# Patient Record
Sex: Female | Born: 1955 | Race: White | Hispanic: No | Marital: Married | Smoking: Current every day smoker
Health system: Southern US, Community
[De-identification: ages and names within clinical notes are randomized; demographics above are authoritative.]

## PROBLEM LIST (undated history)

## (undated) DIAGNOSIS — G43909 Migraine, unspecified, not intractable, without status migrainosus: Secondary | ICD-10-CM

## (undated) DIAGNOSIS — F419 Anxiety disorder, unspecified: Secondary | ICD-10-CM

## (undated) DIAGNOSIS — F32A Depression, unspecified: Secondary | ICD-10-CM

## (undated) DIAGNOSIS — T7840XA Allergy, unspecified, initial encounter: Secondary | ICD-10-CM

## (undated) DIAGNOSIS — I10 Essential (primary) hypertension: Secondary | ICD-10-CM

## (undated) DIAGNOSIS — M810 Age-related osteoporosis without current pathological fracture: Secondary | ICD-10-CM

## (undated) DIAGNOSIS — M1990 Unspecified osteoarthritis, unspecified site: Secondary | ICD-10-CM

## (undated) HISTORY — DX: Anxiety disorder, unspecified: F41.9

## (undated) HISTORY — DX: Depression, unspecified: F32.A

## (undated) HISTORY — DX: Age-related osteoporosis without current pathological fracture: M81.0

## (undated) HISTORY — DX: Allergy, unspecified, initial encounter: T78.40XA

## (undated) HISTORY — DX: Migraine, unspecified, not intractable, without status migrainosus: G43.909

## (undated) HISTORY — DX: Unspecified osteoarthritis, unspecified site: M19.90

## (undated) HISTORY — PX: CRYOABLATION: SHX1415

## (undated) HISTORY — DX: Essential (primary) hypertension: I10

## (undated) HISTORY — PX: TUBAL LIGATION: SHX77

---

## 2006-09-27 HISTORY — PX: ENDOMETRIAL ABLATION: SHX621

## 2008-02-15 ENCOUNTER — Other Ambulatory Visit: Admission: RE | Admit: 2008-02-15 | Discharge: 2008-02-15 | Payer: Self-pay | Admitting: Internal Medicine

## 2008-03-21 ENCOUNTER — Encounter: Admission: RE | Admit: 2008-03-21 | Discharge: 2008-03-21 | Payer: Self-pay | Admitting: Internal Medicine

## 2008-03-28 ENCOUNTER — Encounter: Admission: RE | Admit: 2008-03-28 | Discharge: 2008-03-28 | Payer: Self-pay | Admitting: Internal Medicine

## 2008-04-04 ENCOUNTER — Ambulatory Visit: Payer: Self-pay | Admitting: Internal Medicine

## 2008-04-15 ENCOUNTER — Ambulatory Visit: Payer: Self-pay | Admitting: Internal Medicine

## 2009-05-22 ENCOUNTER — Ambulatory Visit: Payer: Self-pay | Admitting: Internal Medicine

## 2009-06-05 ENCOUNTER — Encounter: Admission: RE | Admit: 2009-06-05 | Discharge: 2009-06-05 | Payer: Self-pay | Admitting: Internal Medicine

## 2009-06-19 ENCOUNTER — Ambulatory Visit: Payer: Self-pay | Admitting: Internal Medicine

## 2009-07-24 ENCOUNTER — Ambulatory Visit: Payer: Self-pay | Admitting: Internal Medicine

## 2010-01-26 ENCOUNTER — Ambulatory Visit: Payer: Self-pay | Admitting: Internal Medicine

## 2010-01-30 ENCOUNTER — Ambulatory Visit: Payer: Self-pay | Admitting: Internal Medicine

## 2010-03-02 ENCOUNTER — Ambulatory Visit: Payer: Self-pay | Admitting: Internal Medicine

## 2010-06-04 ENCOUNTER — Other Ambulatory Visit: Admission: RE | Admit: 2010-06-04 | Discharge: 2010-06-04 | Payer: Self-pay | Admitting: Internal Medicine

## 2010-06-04 ENCOUNTER — Ambulatory Visit: Payer: Self-pay | Admitting: Internal Medicine

## 2010-07-09 ENCOUNTER — Encounter: Admission: RE | Admit: 2010-07-09 | Discharge: 2010-07-09 | Payer: Self-pay | Admitting: Internal Medicine

## 2010-07-09 LAB — HM MAMMOGRAPHY

## 2011-01-07 ENCOUNTER — Other Ambulatory Visit: Payer: Self-pay | Admitting: Internal Medicine

## 2011-03-27 ENCOUNTER — Other Ambulatory Visit: Payer: Self-pay | Admitting: Internal Medicine

## 2011-07-13 ENCOUNTER — Other Ambulatory Visit: Payer: Self-pay | Admitting: Internal Medicine

## 2011-07-13 ENCOUNTER — Telehealth: Payer: Self-pay | Admitting: Internal Medicine

## 2011-07-13 DIAGNOSIS — Z1231 Encounter for screening mammogram for malignant neoplasm of breast: Secondary | ICD-10-CM

## 2011-07-14 NOTE — Telephone Encounter (Signed)
Nicole Bowers Please pull chart  For Nicole Bowers to evaluate when last bone density was done. Cannot answer question without knowledge of last bone density report and results.

## 2011-07-15 NOTE — Telephone Encounter (Signed)
Last bone density done in 2009 with normal results for age.

## 2011-07-16 ENCOUNTER — Other Ambulatory Visit: Payer: Self-pay | Admitting: Internal Medicine

## 2011-07-16 DIAGNOSIS — N951 Menopausal and female climacteric states: Secondary | ICD-10-CM

## 2011-07-16 NOTE — Telephone Encounter (Signed)
Last bone density was normal 3 years ago. [facility]

## 2011-07-16 NOTE — Telephone Encounter (Signed)
Order for BDS faxed to [facility].

## 2011-07-20 ENCOUNTER — Other Ambulatory Visit: Payer: Self-pay | Admitting: Internal Medicine

## 2011-07-22 ENCOUNTER — Encounter: Payer: Self-pay | Admitting: Internal Medicine

## 2011-08-05 ENCOUNTER — Encounter: Payer: Self-pay | Admitting: Internal Medicine

## 2011-08-09 ENCOUNTER — Other Ambulatory Visit: Payer: Self-pay | Admitting: Internal Medicine

## 2011-08-10 ENCOUNTER — Encounter: Payer: Self-pay | Admitting: Internal Medicine

## 2011-08-12 ENCOUNTER — Ambulatory Visit: Payer: Self-pay

## 2011-08-12 ENCOUNTER — Other Ambulatory Visit: Payer: Self-pay

## 2011-09-02 ENCOUNTER — Ambulatory Visit
Admission: RE | Admit: 2011-09-02 | Discharge: 2011-09-02 | Disposition: A | Discharge status: Home / Self Care | Payer: BC Managed Care – PPO | Source: Ambulatory Visit | Attending: Internal Medicine | Admitting: Internal Medicine

## 2011-09-02 DIAGNOSIS — Z1231 Encounter for screening mammogram for malignant neoplasm of breast: Secondary | ICD-10-CM

## 2011-09-02 DIAGNOSIS — N951 Menopausal and female climacteric states: Secondary | ICD-10-CM

## 2011-09-07 ENCOUNTER — Other Ambulatory Visit: Payer: BC Managed Care – PPO | Admitting: Internal Medicine

## 2011-09-07 DIAGNOSIS — I10 Essential (primary) hypertension: Secondary | ICD-10-CM

## 2011-09-07 DIAGNOSIS — Z0000 Encounter for general adult medical examination without abnormal findings: Secondary | ICD-10-CM

## 2011-09-07 LAB — COMPREHENSIVE METABOLIC PANEL
ALT: 18 U/L (ref 0–35)
AST: 18 U/L (ref 0–37)
Albumin: 4.4 g/dL (ref 3.5–5.2)
Alkaline Phosphatase: 86 U/L (ref 39–117)
BUN: 20 mg/dL (ref 6–23)
CO2: 25 mEq/L (ref 19–32)
Calcium: 9.6 mg/dL (ref 8.4–10.5)
Chloride: 103 mEq/L (ref 96–112)
Creat: 0.8 mg/dL (ref 0.50–1.10)
Glucose, Bld: 99 mg/dL (ref 70–99)
Potassium: 5 mEq/L (ref 3.5–5.3)
Sodium: 136 mEq/L (ref 135–145)
Total Bilirubin: 0.3 mg/dL (ref 0.3–1.2)
Total Protein: 6.9 g/dL (ref 6.0–8.3)

## 2011-09-07 LAB — CBC WITH DIFFERENTIAL/PLATELET
Basophils Absolute: 0 10*3/uL (ref 0.0–0.1)
Basophils Relative: 0 % (ref 0–1)
Eosinophils Absolute: 0.1 10*3/uL (ref 0.0–0.7)
Eosinophils Relative: 1 % (ref 0–5)
HCT: 45.3 % (ref 36.0–46.0)
Hemoglobin: 14.9 g/dL (ref 12.0–15.0)
Lymphocytes Relative: 23 % (ref 12–46)
Lymphs Abs: 1.9 10*3/uL (ref 0.7–4.0)
MCH: 28.4 pg (ref 26.0–34.0)
MCHC: 32.9 g/dL (ref 30.0–36.0)
MCV: 86.5 fL (ref 78.0–100.0)
Monocytes Absolute: 0.5 10*3/uL (ref 0.1–1.0)
Monocytes Relative: 6 % (ref 3–12)
Neutro Abs: 5.7 10*3/uL (ref 1.7–7.7)
Neutrophils Relative %: 70 % (ref 43–77)
Platelets: 259 10*3/uL (ref 150–400)
RBC: 5.24 MIL/uL — ABNORMAL HIGH (ref 3.87–5.11)
RDW: 14.8 % (ref 11.5–15.5)
WBC: 8.2 10*3/uL (ref 4.0–10.5)

## 2011-09-07 LAB — LIPID PANEL
Cholesterol: 190 mg/dL (ref 0–200)
HDL: 68 mg/dL (ref >39)
LDL Cholesterol: 113 mg/dL — ABNORMAL HIGH (ref 0–99)
Total CHOL/HDL Ratio: 2.8 Ratio
Triglycerides: 47 mg/dL (ref <150)
VLDL: 9 mg/dL (ref 0–40)

## 2011-09-07 LAB — TSH: TSH: 1.252 u[IU]/mL (ref 0.350–4.500)

## 2011-09-08 LAB — VITAMIN D 25 HYDROXY (VIT D DEFICIENCY, FRACTURES): Vit D, 25-Hydroxy: 40 ng/mL (ref 30–89)

## 2011-09-09 ENCOUNTER — Ambulatory Visit (INDEPENDENT_AMBULATORY_CARE_PROVIDER_SITE_OTHER): Payer: BC Managed Care – PPO | Admitting: Internal Medicine

## 2011-09-09 DIAGNOSIS — Z0000 Encounter for general adult medical examination without abnormal findings: Secondary | ICD-10-CM

## 2011-09-09 DIAGNOSIS — I10 Essential (primary) hypertension: Secondary | ICD-10-CM

## 2011-09-09 DIAGNOSIS — M7918 Myalgia, other site: Secondary | ICD-10-CM

## 2011-09-09 DIAGNOSIS — F419 Anxiety disorder, unspecified: Secondary | ICD-10-CM

## 2011-09-27 ENCOUNTER — Encounter: Payer: Self-pay | Admitting: Internal Medicine

## 2011-09-27 DIAGNOSIS — F418 Other specified anxiety disorders: Secondary | ICD-10-CM | POA: Insufficient documentation

## 2011-09-27 DIAGNOSIS — M7918 Myalgia, other site: Secondary | ICD-10-CM | POA: Insufficient documentation

## 2011-09-27 DIAGNOSIS — I10 Essential (primary) hypertension: Secondary | ICD-10-CM | POA: Insufficient documentation

## 2011-09-27 DIAGNOSIS — F419 Anxiety disorder, unspecified: Secondary | ICD-10-CM | POA: Insufficient documentation

## 2011-09-27 NOTE — Patient Instructions (Addendum)
Continue same medication and return in 6 months. 

## 2011-12-12 ENCOUNTER — Encounter: Payer: Self-pay | Admitting: Internal Medicine
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
## 2012-03-09 ENCOUNTER — Encounter: Payer: Self-pay | Admitting: Internal Medicine

## 2012-03-09 ENCOUNTER — Ambulatory Visit (INDEPENDENT_AMBULATORY_CARE_PROVIDER_SITE_OTHER): Payer: BC Managed Care – PPO | Admitting: Internal Medicine

## 2012-03-09 VITALS — BP 142/88 | HR 72 | Temp 98.6°F | Wt 143.0 lb

## 2012-03-09 DIAGNOSIS — F411 Generalized anxiety disorder: Secondary | ICD-10-CM

## 2012-03-09 DIAGNOSIS — IMO0002 Reserved for concepts with insufficient information to code with codable children: Secondary | ICD-10-CM

## 2012-03-09 DIAGNOSIS — I10 Essential (primary) hypertension: Secondary | ICD-10-CM

## 2012-03-09 DIAGNOSIS — F419 Anxiety disorder, unspecified: Secondary | ICD-10-CM

## 2012-03-26 ENCOUNTER — Encounter: Payer: Self-pay | Admitting: Internal Medicine

## 2012-03-26 NOTE — Patient Instructions (Addendum)
Continue same medications and return in 6 months. For paronychia take antibiotics as prescribed
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
## 2012-09-11 ENCOUNTER — Other Ambulatory Visit: Payer: BC Managed Care – PPO | Admitting: Internal Medicine

## 2012-09-11 DIAGNOSIS — I10 Essential (primary) hypertension: Secondary | ICD-10-CM

## 2012-09-11 DIAGNOSIS — E559 Vitamin D deficiency, unspecified: Secondary | ICD-10-CM

## 2012-09-11 LAB — COMPREHENSIVE METABOLIC PANEL
ALT: 29 U/L (ref 0–35)
AST: 22 U/L (ref 0–37)
Albumin: 4.5 g/dL (ref 3.5–5.2)
BUN: 18 mg/dL (ref 6–23)
CO2: 18 mEq/L — ABNORMAL LOW (ref 19–32)
Calcium: 9.7 mg/dL (ref 8.4–10.5)
Chloride: 103 mEq/L (ref 96–112)
Creat: 0.8 mg/dL (ref 0.50–1.10)
Glucose, Bld: 91 mg/dL (ref 70–99)
Potassium: 4.5 mEq/L (ref 3.5–5.3)
Sodium: 140 mEq/L (ref 135–145)
Total Bilirubin: 0.4 mg/dL (ref 0.3–1.2)
Total Protein: 7.1 g/dL (ref 6.0–8.3)

## 2012-09-11 LAB — CBC WITH DIFFERENTIAL/PLATELET
Basophils Absolute: 0 10*3/uL (ref 0.0–0.1)
Eosinophils Absolute: 0.1 10*3/uL (ref 0.0–0.7)
Eosinophils Relative: 1 % (ref 0–5)
HCT: 44.3 % (ref 36.0–46.0)
Hemoglobin: 14.6 g/dL (ref 12.0–15.0)
Lymphocytes Relative: 29 % (ref 12–46)
Lymphs Abs: 2 10*3/uL (ref 0.7–4.0)
MCV: 85 fL (ref 78.0–100.0)
Monocytes Absolute: 0.5 10*3/uL (ref 0.1–1.0)
Monocytes Relative: 7 % (ref 3–12)
Neutro Abs: 4.2 10*3/uL (ref 1.7–7.7)
Platelets: 245 10*3/uL (ref 150–400)
RDW: 13.9 % (ref 11.5–15.5)
WBC: 6.7 10*3/uL (ref 4.0–10.5)

## 2012-09-11 LAB — LIPID PANEL
Cholesterol: 185 mg/dL (ref 0–200)
HDL: 91 mg/dL (ref >39)
Total CHOL/HDL Ratio: 2 Ratio
Triglycerides: 41 mg/dL (ref <150)
VLDL: 8 mg/dL (ref 0–40)

## 2012-09-11 LAB — TSH: TSH: 1.626 u[IU]/mL (ref 0.350–4.500)

## 2012-09-12 ENCOUNTER — Ambulatory Visit (INDEPENDENT_AMBULATORY_CARE_PROVIDER_SITE_OTHER): Payer: BC Managed Care – PPO | Admitting: Internal Medicine

## 2012-09-12 ENCOUNTER — Encounter: Payer: Self-pay | Admitting: Internal Medicine

## 2012-09-12 VITALS — BP 124/82 | HR 80 | Temp 98.5°F | Ht 64.0 in | Wt 151.0 lb

## 2012-09-12 DIAGNOSIS — I10 Essential (primary) hypertension: Secondary | ICD-10-CM

## 2012-09-12 DIAGNOSIS — F411 Generalized anxiety disorder: Secondary | ICD-10-CM

## 2012-09-12 LAB — POCT URINALYSIS DIPSTICK
Bilirubin, UA: NEGATIVE
Blood, UA: NEGATIVE
Glucose, UA: NEGATIVE
Ketones, UA: NEGATIVE
Leukocytes, UA: NEGATIVE
Nitrite, UA: NEGATIVE
Protein, UA: NEGATIVE
Spec Grav, UA: 1.01

## 2012-09-12 LAB — VITAMIN D 25 HYDROXY (VIT D DEFICIENCY, FRACTURES): Vit D, 25-Hydroxy: 44 ng/mL (ref 30–89)

## 2012-09-12 NOTE — Patient Instructions (Addendum)
Continue same meds and return in 6 months 

## 2012-12-10 ENCOUNTER — Encounter: Payer: Self-pay | Admitting: Internal Medicine
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
## 2012-12-26 ENCOUNTER — Other Ambulatory Visit: Payer: Self-pay

## 2012-12-26 MED ORDER — RAMIPRIL 10 MG PO CAPS: 10.0000 mg | ORAL_CAPSULE | Freq: Every day | ORAL | Status: DC

## 2013-06-17 ENCOUNTER — Other Ambulatory Visit: Payer: Self-pay | Admitting: Internal Medicine

## 2013-09-14 ENCOUNTER — Other Ambulatory Visit: Payer: BC Managed Care – PPO | Admitting: Internal Medicine

## 2013-09-17 ENCOUNTER — Encounter: Payer: BC Managed Care – PPO | Admitting: Internal Medicine

## 2013-10-22 ENCOUNTER — Other Ambulatory Visit: Payer: BC Managed Care – PPO | Admitting: Internal Medicine

## 2013-10-22 DIAGNOSIS — Z130 Encounter for screening for diseases of the blood and blood-forming organs and certain disorders involving the immune mechanism: Secondary | ICD-10-CM

## 2013-10-22 DIAGNOSIS — Z13228 Encounter for screening for other metabolic disorders: Secondary | ICD-10-CM

## 2013-10-22 DIAGNOSIS — I10 Essential (primary) hypertension: Secondary | ICD-10-CM

## 2013-10-22 DIAGNOSIS — Z13220 Encounter for screening for lipoid disorders: Secondary | ICD-10-CM

## 2013-10-22 DIAGNOSIS — Z1329 Encounter for screening for other suspected endocrine disorder: Secondary | ICD-10-CM

## 2013-10-22 LAB — TSH: TSH: 1.73 u[IU]/mL (ref 0.350–4.500)

## 2013-10-22 LAB — COMPREHENSIVE METABOLIC PANEL
ALT: 17 U/L (ref 0–35)
AST: 15 U/L (ref 0–37)
Albumin: 4.4 g/dL (ref 3.5–5.2)
Alkaline Phosphatase: 92 U/L (ref 39–117)
BUN: 21 mg/dL (ref 6–23)
CO2: 28 mEq/L (ref 19–32)
Calcium: 9.5 mg/dL (ref 8.4–10.5)
Chloride: 104 mEq/L (ref 96–112)
Creat: 0.88 mg/dL (ref 0.50–1.10)
Glucose, Bld: 102 mg/dL — ABNORMAL HIGH (ref 70–99)
Potassium: 4.4 mEq/L (ref 3.5–5.3)
Sodium: 137 mEq/L (ref 135–145)
Total Bilirubin: 0.4 mg/dL (ref 0.3–1.2)
Total Protein: 7 g/dL (ref 6.0–8.3)

## 2013-10-22 LAB — LIPID PANEL
Cholesterol: 194 mg/dL (ref 0–200)
HDL: 74 mg/dL (ref >39)
LDL Cholesterol: 109 mg/dL — ABNORMAL HIGH (ref 0–99)
Total CHOL/HDL Ratio: 2.6 Ratio
Triglycerides: 57 mg/dL (ref <150)
VLDL: 11 mg/dL (ref 0–40)

## 2013-10-22 LAB — CBC WITH DIFFERENTIAL/PLATELET
Basophils Absolute: 0 10*3/uL (ref 0.0–0.1)
Basophils Relative: 0 % (ref 0–1)
Eosinophils Absolute: 0.1 10*3/uL (ref 0.0–0.7)
Eosinophils Relative: 1 % (ref 0–5)
HCT: 42.9 % (ref 36.0–46.0)
Hemoglobin: 14.1 g/dL (ref 12.0–15.0)
Lymphocytes Relative: 34 % (ref 12–46)
Lymphs Abs: 2.4 10*3/uL (ref 0.7–4.0)
MCH: 28.1 pg (ref 26.0–34.0)
MCHC: 32.9 g/dL (ref 30.0–36.0)
MCV: 85.5 fL (ref 78.0–100.0)
Monocytes Absolute: 0.5 10*3/uL (ref 0.1–1.0)
Monocytes Relative: 7 % (ref 3–12)
Neutro Abs: 4.1 10*3/uL (ref 1.7–7.7)
Neutrophils Relative %: 58 % (ref 43–77)
Platelets: 252 10*3/uL (ref 150–400)
RBC: 5.02 MIL/uL (ref 3.87–5.11)
RDW: 14 % (ref 11.5–15.5)
WBC: 7.1 10*3/uL (ref 4.0–10.5)

## 2013-10-23 ENCOUNTER — Encounter: Payer: Self-pay | Admitting: Internal Medicine

## 2013-10-23 LAB — VITAMIN D 25 HYDROXY (VIT D DEFICIENCY, FRACTURES): Vit D, 25-Hydroxy: 40 ng/mL (ref 30–89)

## 2013-10-26 ENCOUNTER — Other Ambulatory Visit: Payer: Self-pay | Admitting: Internal Medicine

## 2013-10-30 ENCOUNTER — Other Ambulatory Visit: Payer: Self-pay | Admitting: Internal Medicine

## 2013-10-30 ENCOUNTER — Other Ambulatory Visit: Payer: Self-pay

## 2013-10-30 DIAGNOSIS — N951 Menopausal and female climacteric states: Secondary | ICD-10-CM

## 2013-10-30 DIAGNOSIS — Z1231 Encounter for screening mammogram for malignant neoplasm of breast: Secondary | ICD-10-CM

## 2013-11-05 ENCOUNTER — Other Ambulatory Visit (HOSPITAL_COMMUNITY)
Admission: RE | Admit: 2013-11-05 | Discharge: 2013-11-05 | Disposition: A | Discharge status: Home / Self Care | Payer: BC Managed Care – PPO | Source: Ambulatory Visit | Attending: Internal Medicine | Admitting: Internal Medicine

## 2013-11-05 ENCOUNTER — Ambulatory Visit (INDEPENDENT_AMBULATORY_CARE_PROVIDER_SITE_OTHER): Payer: BC Managed Care – PPO | Admitting: Internal Medicine

## 2013-11-05 ENCOUNTER — Encounter: Payer: Self-pay | Admitting: Internal Medicine

## 2013-11-05 VITALS — BP 140/82 | HR 84 | Temp 98.9°F | Ht 64.0 in | Wt 150.0 lb

## 2013-11-05 DIAGNOSIS — Z0000 Encounter for general adult medical examination without abnormal findings: Secondary | ICD-10-CM

## 2013-11-05 DIAGNOSIS — Z8659 Personal history of other mental and behavioral disorders: Secondary | ICD-10-CM

## 2013-11-05 DIAGNOSIS — IMO0001 Reserved for inherently not codable concepts without codable children: Secondary | ICD-10-CM

## 2013-11-05 DIAGNOSIS — M7918 Myalgia, other site: Secondary | ICD-10-CM

## 2013-11-05 DIAGNOSIS — I10 Essential (primary) hypertension: Secondary | ICD-10-CM

## 2013-11-05 DIAGNOSIS — Z01419 Encounter for gynecological examination (general) (routine) without abnormal findings: Secondary | ICD-10-CM | POA: Insufficient documentation

## 2013-11-05 LAB — POCT URINALYSIS DIPSTICK
Bilirubin, UA: NEGATIVE
Blood, UA: NEGATIVE
Glucose, UA: NEGATIVE
Ketones, UA: NEGATIVE
Leukocytes, UA: NEGATIVE
Nitrite, UA: NEGATIVE
Protein, UA: NEGATIVE
Spec Grav, UA: 1.01
Urobilinogen, UA: NEGATIVE
pH, UA: 5.5

## 2013-11-09 NOTE — Progress Notes (Signed)
Patient informed. Will come in on Monday 11/12/2013 for repeat Pap.

## 2013-11-12 ENCOUNTER — Ambulatory Visit: Payer: BC Managed Care – PPO

## 2013-11-12 ENCOUNTER — Ambulatory Visit
Admission: RE | Admit: 2013-11-12 | Discharge: 2013-11-12 | Disposition: A | Discharge status: Home / Self Care | Payer: BC Managed Care – PPO | Source: Ambulatory Visit | Attending: Internal Medicine | Admitting: Internal Medicine

## 2013-11-12 ENCOUNTER — Ambulatory Visit
Admission: RE | Admit: 2013-11-12 | Discharge: 2013-11-12 | Disposition: A | Discharge status: Home / Self Care | Payer: BC Managed Care – PPO | Source: Ambulatory Visit

## 2013-11-12 ENCOUNTER — Ambulatory Visit (INDEPENDENT_AMBULATORY_CARE_PROVIDER_SITE_OTHER): Payer: BC Managed Care – PPO | Admitting: Internal Medicine

## 2013-11-12 DIAGNOSIS — R87615 Unsatisfactory cytologic smear of cervix: Secondary | ICD-10-CM

## 2013-11-12 DIAGNOSIS — N951 Menopausal and female climacteric states: Secondary | ICD-10-CM

## 2013-11-12 DIAGNOSIS — Z124 Encounter for screening for malignant neoplasm of cervix: Secondary | ICD-10-CM

## 2013-11-12 DIAGNOSIS — Z0000 Encounter for general adult medical examination without abnormal findings: Secondary | ICD-10-CM

## 2013-11-12 DIAGNOSIS — Z1231 Encounter for screening mammogram for malignant neoplasm of breast: Secondary | ICD-10-CM

## 2013-11-19 ENCOUNTER — Other Ambulatory Visit: Payer: Self-pay | Admitting: Internal Medicine

## 2013-11-24 ENCOUNTER — Encounter: Payer: Self-pay | Admitting: Internal Medicine

## 2013-11-24 NOTE — Patient Instructions (Signed)
Return as needed

## 2013-11-24 NOTE — Progress Notes (Signed)
   Subjective:    Patient ID: Nicole Bowers, female    DOB: 10-10-1955, 58 y.o.   MRN: [id number]  HPI returns for repeat Pap smear as there were no endocervical cells on last Pap done at time of physical exam    Review of Systems     Objective:   Physical Exam using saline coated Cytobrush Pap was repeated        Assessment & Plan:  Repeat Pap smear due to no endocervical cells on previous Pap smear

## 2013-12-03 ENCOUNTER — Telehealth: Payer: Self-pay | Admitting: Internal Medicine

## 2013-12-03 ENCOUNTER — Other Ambulatory Visit: Payer: Self-pay

## 2013-12-03 MED ORDER — ALPRAZOLAM 0.5 MG PO TABS: 0.5000 mg | ORAL_TABLET | Freq: Every evening | ORAL | Status: DC | PRN

## 2013-12-03 NOTE — Telephone Encounter (Signed)
Please refill Xanax

## 2014-01-31 ENCOUNTER — Encounter: Payer: Self-pay | Admitting: Internal Medicine

## 2014-01-31 ENCOUNTER — Ambulatory Visit (INDEPENDENT_AMBULATORY_CARE_PROVIDER_SITE_OTHER): Payer: BC Managed Care – PPO | Admitting: Internal Medicine

## 2014-01-31 VITALS — BP 134/84 | HR 80 | Temp 97.9°F | Wt 155.0 lb

## 2014-01-31 DIAGNOSIS — M25532 Pain in left wrist: Secondary | ICD-10-CM

## 2014-01-31 DIAGNOSIS — M25539 Pain in unspecified wrist: Secondary | ICD-10-CM

## 2014-01-31 MED ORDER — METHYLPREDNISOLONE (PAK) 4 MG PO TABS: ORAL_TABLET | ORAL | Status: DC

## 2014-02-02 NOTE — Progress Notes (Signed)
   Subjective:    Patient ID: Nicole Bowers, female    DOB: 02/14/1956, 58 y.o.   MRN: [id number]  HPI  In today with left wrist pain. No fall or injury that she is aware of. 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 There is tenderness in the medial wrist. Suggested an x-ray but she's going out of town tomorrow for a dog show in [place], [place]. Will get it x-rayed next week. Good range of motion in left wrist just pain with flexion extending up to mid forearm dorsal aspect. No numbness in hand or tingling.    Review of Systems     Objective:   Physical Exam  Pain with flexion to mid forearm dorsal aspect. Slight tenderness medial left wrist to palpation.      Assessment & Plan:  Probable tendinitis left wrist  Plan: To have x-ray. Steroid 6 day dosepak take as directed. Recommend icing wrist for 20 minutes twice daily.

## 2014-02-02 NOTE — Patient Instructions (Signed)
Take steroid dosepak in tapering course as directed. Ice wrist for 20 minutes twice daily until improved. Get x-ray next week

## 2014-02-04 ENCOUNTER — Ambulatory Visit
Admission: RE | Admit: 2014-02-04 | Discharge: 2014-02-04 | Disposition: A | Discharge status: Home / Self Care | Payer: BC Managed Care – PPO | Source: Ambulatory Visit | Attending: Internal Medicine | Admitting: Internal Medicine

## 2014-02-04 DIAGNOSIS — M25532 Pain in left wrist: Secondary | ICD-10-CM

## 2014-03-20 ENCOUNTER — Telehealth: Payer: Self-pay | Admitting: Internal Medicine

## 2014-03-20 NOTE — Telephone Encounter (Signed)
Please refer to hand surgeon for evaluation. Dr. Merlyn LotKuzma is fine.

## 2014-03-21 NOTE — Telephone Encounter (Signed)
Appointment made w/Dr. Cindee SaltGary Kuzma (sp w/[facility] @ [phone]) for Monday, 6/29 @ 2:45 (arrive at 2:15).  Faxed notes/demographics to [facility] @ [phone].  Spoke w/[facility]; patient can pick up disc of image on Friday, 6/26 and bring ID w/her.    Spoke with patient and advised of appointment date/time, phone #, address.  Advised to call and speak with Alvino Chapelllen @ [phone] to confirm appointment.  Patient also [facility] to pick [facility] disc of wrist image and take with her to Dr. Merrilee SeashoreKuzma's appointment.
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
## 2014-04-21 NOTE — Patient Instructions (Signed)
Continue same medication for hypertension. Monitor blood pressure. Call if readings are persistently elevated. Otherwise return in 6-12 months. Have mammogram and bone density study.

## 2014-06-04 ENCOUNTER — Ambulatory Visit (INDEPENDENT_AMBULATORY_CARE_PROVIDER_SITE_OTHER): Payer: BC Managed Care – PPO | Admitting: Internal Medicine

## 2014-06-04 ENCOUNTER — Encounter: Payer: Self-pay | Admitting: Internal Medicine

## 2014-06-04 VITALS — BP 140/88 | HR 84 | Resp 12 | Ht 63.5 in | Wt 158.0 lb

## 2014-06-04 DIAGNOSIS — R030 Elevated blood-pressure reading, without diagnosis of hypertension: Secondary | ICD-10-CM

## 2014-06-04 DIAGNOSIS — IMO0001 Reserved for inherently not codable concepts without codable children: Secondary | ICD-10-CM

## 2014-06-04 DIAGNOSIS — F411 Generalized anxiety disorder: Secondary | ICD-10-CM

## 2014-06-05 ENCOUNTER — Other Ambulatory Visit: Payer: Self-pay | Admitting: Internal Medicine

## 2014-06-19 ENCOUNTER — Other Ambulatory Visit: Payer: Self-pay | Admitting: Internal Medicine

## 2014-07-02 ENCOUNTER — Encounter: Payer: Self-pay | Admitting: Internal Medicine

## 2014-07-08 ENCOUNTER — Telehealth: Payer: Self-pay

## 2014-07-08 MED ORDER — CLOTRIMAZOLE 1 % EX CREA: 1.0000 "application " | TOPICAL_CREAM | Freq: Two times a day (BID) | CUTANEOUS | Status: DC

## 2014-07-08 MED ORDER — TERCONAZOLE 0.4 % VA CREA: TOPICAL_CREAM | VAGINAL | Status: DC

## 2014-07-08 NOTE — Telephone Encounter (Signed)
Refill request for clotrimazole cream and terconazole cream.  Please advise.

## 2014-07-08 NOTE — Telephone Encounter (Signed)
Refill both with 3 refills each

## 2014-08-18 ENCOUNTER — Encounter: Payer: Self-pay | Admitting: Internal Medicine
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
## 2014-08-18 NOTE — Patient Instructions (Signed)
Continue to monitor blood pressure and let me know if persistently elevated

## 2014-09-03 ENCOUNTER — Ambulatory Visit (INDEPENDENT_AMBULATORY_CARE_PROVIDER_SITE_OTHER): Payer: BC Managed Care – PPO | Admitting: Internal Medicine

## 2014-09-03 ENCOUNTER — Telehealth: Payer: Self-pay

## 2014-09-03 ENCOUNTER — Encounter: Payer: Self-pay | Admitting: Internal Medicine

## 2014-09-03 VITALS — BP 128/88 | HR 89 | Temp 97.0°F | Wt 158.0 lb

## 2014-09-03 DIAGNOSIS — B373 Candidiasis of vulva and vagina: Secondary | ICD-10-CM

## 2014-09-03 DIAGNOSIS — N951 Menopausal and female climacteric states: Secondary | ICD-10-CM

## 2014-09-03 DIAGNOSIS — N949 Unspecified condition associated with female genital organs and menstrual cycle: Secondary | ICD-10-CM

## 2014-09-03 DIAGNOSIS — B3731 Acute candidiasis of vulva and vagina: Secondary | ICD-10-CM

## 2014-09-03 LAB — POCT WET PREP (WET MOUNT)
Bacteria Wet Prep HPF POC: NEGATIVE
TRICHOMONAS WET PREP HPF POC: NEGATIVE
WBC WET PREP: NEGATIVE

## 2014-09-03 LAB — POCT URINALYSIS DIPSTICK
Bilirubin, UA: NEGATIVE
Glucose, UA: NEGATIVE
Ketones, UA: NEGATIVE
Leukocytes, UA: NEGATIVE
NITRITE UA: NEGATIVE
PROTEIN UA: NEGATIVE
Spec Grav, UA: 1.01
UROBILINOGEN UA: NEGATIVE
pH, UA: 6.5

## 2014-09-03 MED ORDER — FLUCONAZOLE 150 MG PO TABS: 150.0000 mg | ORAL_TABLET | Freq: Once | ORAL | Status: DC

## 2014-09-03 MED ORDER — ESTRADIOL 0.1 MG/GM VA CREA: 1.0000 | TOPICAL_CREAM | VAGINAL | Status: DC

## 2014-09-03 NOTE — Telephone Encounter (Signed)
Patient has an appointment with Dr Seymour BarsLavoie at [facility] on 09/30/2013 at 1045 am.

## 2014-09-14 ENCOUNTER — Other Ambulatory Visit: Payer: Self-pay | Admitting: Internal Medicine

## 2014-09-16 ENCOUNTER — Other Ambulatory Visit: Payer: Self-pay | Admitting: *Deleted

## 2014-09-16 MED ORDER — ALPRAZOLAM 0.5 MG PO TABS: 0.5000 mg | ORAL_TABLET | Freq: Every evening | ORAL | Status: DC | PRN

## 2014-09-16 NOTE — Telephone Encounter (Signed)
Script for xanax called into pharmacy patient notified

## 2014-09-16 NOTE — Telephone Encounter (Signed)
Please refill and closed.

## 2014-09-16 NOTE — Telephone Encounter (Deleted)
Script called in

## 2014-09-16 NOTE — Telephone Encounter (Signed)
Refill x 6 months 

## 2014-09-25 NOTE — Patient Instructions (Addendum)
Take Diflucan 150 mg tablet by mouth once. Try Premarin vaginal cream 3 times weekly. Appointment with GYN.

## 2014-09-25 NOTE — Progress Notes (Signed)
   Subjective:    Patient ID: Nicole Bowers, female    DOB: 24-Apr-1956, 58 y.o.   MRN: [id number]  HPI  Patient continues to complain of vaginal irritation/discomfort. Some itching. Also having some menopausal symptoms with hot flashes. History of anxiety for which she takes Xanax. Wants to know what to do about hot flashes. Really doesn't want to take hormone replacement.    Review of Systems     Objective:   Physical Exam  She has some irritation at the introitus. Wet prep shows just a few yeast. No significant vaginal discharge.      Assessment & Plan:  Hot flashes  Vaginal irritation  History of anxiety  Hypertension  Plan: Diflucan 150 mg 1 time dose tablet by mouth. Premarin vaginal cream 1 applicator full in vagina 3 times weekly. Appointment with GYN physician.  25 minutes spent with patient

## 2014-11-08 ENCOUNTER — Other Ambulatory Visit: Payer: BLUE CROSS/BLUE SHIELD | Admitting: Internal Medicine

## 2014-11-08 DIAGNOSIS — Z1321 Encounter for screening for nutritional disorder: Secondary | ICD-10-CM

## 2014-11-08 DIAGNOSIS — Z1329 Encounter for screening for other suspected endocrine disorder: Secondary | ICD-10-CM

## 2014-11-08 DIAGNOSIS — Z130 Encounter for screening for diseases of the blood and blood-forming organs and certain disorders involving the immune mechanism: Secondary | ICD-10-CM

## 2014-11-08 DIAGNOSIS — Z13220 Encounter for screening for lipoid disorders: Secondary | ICD-10-CM

## 2014-11-08 DIAGNOSIS — Z0000 Encounter for general adult medical examination without abnormal findings: Secondary | ICD-10-CM

## 2014-11-08 LAB — CBC WITH DIFFERENTIAL/PLATELET
BASOS ABS: 0 10*3/uL (ref 0.0–0.1)
Basophils Relative: 0 % (ref 0–1)
Eosinophils Absolute: 0.1 10*3/uL (ref 0.0–0.7)
Eosinophils Relative: 2 % (ref 0–5)
HCT: 44.3 % (ref 36.0–46.0)
HEMOGLOBIN: 14.1 g/dL (ref 12.0–15.0)
LYMPHS ABS: 1.1 10*3/uL (ref 0.7–4.0)
Lymphocytes Relative: 19 % (ref 12–46)
MCH: 27.2 pg (ref 26.0–34.0)
MCHC: 31.8 g/dL (ref 30.0–36.0)
MCV: 85.4 fL (ref 78.0–100.0)
MPV: 9.6 fL (ref 8.6–12.4)
Monocytes Absolute: 0.6 10*3/uL (ref 0.1–1.0)
Monocytes Relative: 11 % (ref 3–12)
NEUTROS ABS: 3.9 10*3/uL (ref 1.7–7.7)
NEUTROS PCT: 68 % (ref 43–77)
PLATELETS: 227 10*3/uL (ref 150–400)
RBC: 5.19 MIL/uL — ABNORMAL HIGH (ref 3.87–5.11)
RDW: 14.1 % (ref 11.5–15.5)
WBC: 5.7 10*3/uL (ref 4.0–10.5)

## 2014-11-08 LAB — LIPID PANEL
Cholesterol: 189 mg/dL (ref 0–200)
HDL: 83 mg/dL (ref >39)
LDL Cholesterol: 95 mg/dL (ref 0–99)
Total CHOL/HDL Ratio: 2.3 Ratio
Triglycerides: 57 mg/dL (ref <150)
VLDL: 11 mg/dL (ref 0–40)

## 2014-11-08 LAB — COMPREHENSIVE METABOLIC PANEL
ALBUMIN: 4.2 g/dL (ref 3.5–5.2)
ALT: 26 U/L (ref 0–35)
AST: 19 U/L (ref 0–37)
Alkaline Phosphatase: 112 U/L (ref 39–117)
BUN: 15 mg/dL (ref 6–23)
CO2: 28 meq/L (ref 19–32)
CREATININE: 0.74 mg/dL (ref 0.50–1.10)
Calcium: 9.7 mg/dL (ref 8.4–10.5)
Chloride: 103 mEq/L (ref 96–112)
Glucose, Bld: 98 mg/dL (ref 70–99)
POTASSIUM: 5.3 meq/L (ref 3.5–5.3)
SODIUM: 139 meq/L (ref 135–145)
TOTAL PROTEIN: 7 g/dL (ref 6.0–8.3)
Total Bilirubin: 0.4 mg/dL (ref 0.2–1.2)

## 2014-11-08 LAB — TSH: TSH: 1.885 u[IU]/mL (ref 0.350–4.500)

## 2014-11-09 LAB — VITAMIN D 25 HYDROXY (VIT D DEFICIENCY, FRACTURES): Vit D, 25-Hydroxy: 31 ng/mL (ref 30–100)

## 2014-11-11 ENCOUNTER — Encounter: Payer: BC Managed Care – PPO | Admitting: Internal Medicine

## 2014-12-09 ENCOUNTER — Ambulatory Visit (INDEPENDENT_AMBULATORY_CARE_PROVIDER_SITE_OTHER): Payer: Managed Care, Other (non HMO) | Admitting: Internal Medicine

## 2014-12-09 VITALS — BP 112/72 | HR 87 | Temp 98.0°F | Ht 64.0 in | Wt 159.0 lb

## 2014-12-09 DIAGNOSIS — F411 Generalized anxiety disorder: Secondary | ICD-10-CM

## 2014-12-09 DIAGNOSIS — I10 Essential (primary) hypertension: Secondary | ICD-10-CM

## 2014-12-09 DIAGNOSIS — Z0000 Encounter for general adult medical examination without abnormal findings: Secondary | ICD-10-CM

## 2014-12-09 LAB — POCT URINALYSIS DIPSTICK
BILIRUBIN UA: NEGATIVE
Glucose, UA: NEGATIVE
Ketones, UA: NEGATIVE
LEUKOCYTES UA: NEGATIVE
Nitrite, UA: NEGATIVE
PH UA: 7.5
PROTEIN UA: NEGATIVE
RBC UA: NEGATIVE
Spec Grav, UA: <=1.005
Urobilinogen, UA: NEGATIVE
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
## 2015-02-22 ENCOUNTER — Encounter: Payer: Self-pay | Admitting: Internal Medicine

## 2015-02-22 NOTE — Patient Instructions (Signed)
Continue same medications and return in 6-12 months 

## 2015-05-04 ENCOUNTER — Other Ambulatory Visit: Payer: Self-pay | Admitting: Internal Medicine

## 2015-05-05 NOTE — Telephone Encounter (Signed)
Refill x 6 months 

## 2015-06-09 ENCOUNTER — Other Ambulatory Visit: Payer: Self-pay | Admitting: Internal Medicine

## 2015-09-03 ENCOUNTER — Other Ambulatory Visit: Payer: Self-pay

## 2015-09-03 MED ORDER — CLOTRIMAZOLE 1 % EX CREA: 1.0000 "application " | TOPICAL_CREAM | Freq: Two times a day (BID) | CUTANEOUS | Status: DC

## 2015-10-20 ENCOUNTER — Encounter: Payer: Self-pay | Admitting: Internal Medicine

## 2015-10-20 ENCOUNTER — Ambulatory Visit (INDEPENDENT_AMBULATORY_CARE_PROVIDER_SITE_OTHER): Payer: Managed Care, Other (non HMO) | Admitting: Internal Medicine

## 2015-10-20 VITALS — BP 142/96 | HR 71 | Resp 20 | Ht 64.0 in | Wt 158.0 lb

## 2015-10-20 DIAGNOSIS — I10 Essential (primary) hypertension: Secondary | ICD-10-CM | POA: Diagnosis not present

## 2015-10-20 DIAGNOSIS — F411 Generalized anxiety disorder: Secondary | ICD-10-CM | POA: Diagnosis not present

## 2015-10-20 MED ORDER — SERTRALINE HCL 50 MG PO TABS: 50.0000 mg | ORAL_TABLET | Freq: Every day | ORAL | Status: DC

## 2015-10-20 NOTE — Patient Instructions (Signed)
Zoloft 25-50 mg daily. One half Xanax every am and one whole tab at night. RTC in 4 weeks.
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
## 2015-11-13 ENCOUNTER — Other Ambulatory Visit: Payer: Self-pay | Admitting: Internal Medicine

## 2015-11-14 NOTE — Telephone Encounter (Signed)
Phoned to pharmacy 

## 2015-11-14 NOTE — Telephone Encounter (Signed)
Refill x 6 months 

## 2015-11-17 ENCOUNTER — Ambulatory Visit (INDEPENDENT_AMBULATORY_CARE_PROVIDER_SITE_OTHER): Payer: Managed Care, Other (non HMO) | Admitting: Internal Medicine

## 2015-11-17 ENCOUNTER — Encounter: Payer: Self-pay | Admitting: Internal Medicine

## 2015-11-17 ENCOUNTER — Other Ambulatory Visit: Payer: Self-pay

## 2015-11-17 VITALS — BP 132/80 | HR 86 | Temp 97.0°F | Resp 20 | Ht 64.0 in | Wt 157.0 lb

## 2015-11-17 DIAGNOSIS — I10 Essential (primary) hypertension: Secondary | ICD-10-CM

## 2015-11-17 DIAGNOSIS — F411 Generalized anxiety disorder: Secondary | ICD-10-CM | POA: Diagnosis not present

## 2015-11-17 MED ORDER — ALPRAZOLAM 0.5 MG PO TABS: 0.5000 mg | ORAL_TABLET | Freq: Every evening | ORAL | Status: DC | PRN

## 2015-11-17 NOTE — Progress Notes (Signed)
   Subjective:    Patient ID: Nicole Bowers, female    DOB: [birth year], 60 y.o.   MRN: [id number]  HPI For follow up of anxiety. Blood pressure today is excellent. Less stress at home. Husband who is status post CABG has new dog and is back to work. Patient much less anxious. She has been watching her blood pressure at home and it has been stable.    Review of Systems as above     Objective:   Physical Exam  Neck: Neck supple. No thyromegaly present.  Cardiovascular: Normal rate, regular rhythm and normal heart sounds.   No murmur heard. Pulmonary/Chest: Breath sounds normal. She has no wheezes. She has no rales.  Lymphadenopathy:    She has no cervical adenopathy.  Vitals reviewed.         Assessment & Plan:  Essential hypertension-stable on current regimen  Anxiety-much improved  Plan: Return in May for physical examination

## 2015-11-17 NOTE — Patient Instructions (Signed)
Continue medication as previously prescribed and return in May for physical exam. It was a pleasure to see you today.

## 2015-12-08 ENCOUNTER — Other Ambulatory Visit: Payer: Self-pay | Admitting: Internal Medicine

## 2016-02-12 DIAGNOSIS — M79644 Pain in right finger(s): Secondary | ICD-10-CM | POA: Insufficient documentation

## 2016-02-13 ENCOUNTER — Other Ambulatory Visit: Payer: Managed Care, Other (non HMO) | Admitting: Internal Medicine

## 2016-02-13 DIAGNOSIS — Z1329 Encounter for screening for other suspected endocrine disorder: Secondary | ICD-10-CM

## 2016-02-13 DIAGNOSIS — Z1321 Encounter for screening for nutritional disorder: Secondary | ICD-10-CM

## 2016-02-13 DIAGNOSIS — I10 Essential (primary) hypertension: Secondary | ICD-10-CM

## 2016-02-13 DIAGNOSIS — Z13220 Encounter for screening for lipoid disorders: Secondary | ICD-10-CM

## 2016-02-13 DIAGNOSIS — Z0000 Encounter for general adult medical examination without abnormal findings: Secondary | ICD-10-CM

## 2016-02-13 DIAGNOSIS — Z130 Encounter for screening for diseases of the blood and blood-forming organs and certain disorders involving the immune mechanism: Secondary | ICD-10-CM

## 2016-02-13 LAB — CBC WITH DIFFERENTIAL/PLATELET
BASOS ABS: 0 {cells}/uL (ref 0–200)
Basophils Relative: 0 %
EOS ABS: 106 {cells}/uL (ref 15–500)
Eosinophils Relative: 2 %
HEMATOCRIT: 38.9 % (ref 35.0–45.0)
HEMOGLOBIN: 12.5 g/dL (ref 11.7–15.5)
LYMPHS ABS: 1802 {cells}/uL (ref 850–3900)
Lymphocytes Relative: 34 %
MCH: 27.7 pg (ref 27.0–33.0)
MCHC: 32.1 g/dL (ref 32.0–36.0)
MCV: 86.1 fL (ref 80.0–100.0)
MONO ABS: 318 {cells}/uL (ref 200–950)
MPV: 9.4 fL (ref 7.5–12.5)
Monocytes Relative: 6 %
NEUTROS ABS: 3074 {cells}/uL (ref 1500–7800)
NEUTROS PCT: 58 %
PLATELETS: 222 10*3/uL (ref 140–400)
RBC: 4.52 MIL/uL (ref 3.80–5.10)
RDW: 14.5 % (ref 11.0–15.0)
WBC: 5.3 10*3/uL (ref 3.8–10.8)

## 2016-02-13 LAB — COMPLETE METABOLIC PANEL WITH GFR
ALT: 82 U/L — ABNORMAL HIGH (ref 6–29)
AST: 42 U/L — AB (ref 10–35)
Albumin: 4.4 g/dL (ref 3.6–5.1)
Alkaline Phosphatase: 142 U/L — ABNORMAL HIGH (ref 33–130)
BUN: 24 mg/dL (ref 7–25)
CO2: 28 mmol/L (ref 20–31)
Calcium: 9.5 mg/dL (ref 8.6–10.4)
Chloride: 104 mmol/L (ref 98–110)
Creat: 0.74 mg/dL (ref 0.50–1.05)
GFR, Est African American: >89 mL/min (ref >=60)
GLUCOSE: 101 mg/dL — AB (ref 65–99)
Potassium: 4.8 mmol/L (ref 3.5–5.3)
Sodium: 140 mmol/L (ref 135–146)
Total Bilirubin: 0.4 mg/dL (ref 0.2–1.2)
Total Protein: 6.8 g/dL (ref 6.1–8.1)

## 2016-02-13 LAB — TSH: TSH: 1.46 mIU/L

## 2016-02-13 LAB — LIPID PANEL
CHOL/HDL RATIO: 2.2 ratio (ref <=5.0)
Cholesterol: 212 mg/dL — ABNORMAL HIGH (ref 125–200)
HDL: 96 mg/dL (ref >=46)
LDL Cholesterol: 107 mg/dL (ref <130)
TRIGLYCERIDES: 46 mg/dL (ref <150)
VLDL: 9 mg/dL (ref <30)

## 2016-02-14 LAB — VITAMIN D 25 HYDROXY (VIT D DEFICIENCY, FRACTURES): Vit D, 25-Hydroxy: 38 ng/mL (ref 30–100)

## 2016-02-16 ENCOUNTER — Encounter: Payer: Self-pay | Admitting: Internal Medicine

## 2016-02-16 ENCOUNTER — Ambulatory Visit (INDEPENDENT_AMBULATORY_CARE_PROVIDER_SITE_OTHER): Payer: Managed Care, Other (non HMO) | Admitting: Internal Medicine

## 2016-02-16 VITALS — BP 134/80 | HR 74 | Temp 97.4°F | Resp 18 | Ht 64.0 in | Wt 159.0 lb

## 2016-02-16 DIAGNOSIS — F411 Generalized anxiety disorder: Secondary | ICD-10-CM

## 2016-02-16 DIAGNOSIS — Z0000 Encounter for general adult medical examination without abnormal findings: Secondary | ICD-10-CM

## 2016-02-16 DIAGNOSIS — R748 Abnormal levels of other serum enzymes: Secondary | ICD-10-CM | POA: Diagnosis not present

## 2016-02-16 DIAGNOSIS — I10 Essential (primary) hypertension: Secondary | ICD-10-CM

## 2016-02-16 LAB — POCT URINALYSIS DIPSTICK
BILIRUBIN UA: NEGATIVE
Glucose, UA: NEGATIVE
Ketones, UA: NEGATIVE
LEUKOCYTES UA: NEGATIVE
Nitrite, UA: NEGATIVE
PH UA: 6
PROTEIN UA: NEGATIVE
RBC UA: NEGATIVE
Spec Grav, UA: 1.015
UROBILINOGEN UA: 0.2

## 2016-02-16 NOTE — Patient Instructions (Signed)
It was a pleasure to see you today. Continue same meds and return in 4 weeks lipid  and liver panels.
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
## 2016-02-18 ENCOUNTER — Other Ambulatory Visit: Payer: Self-pay | Admitting: Internal Medicine

## 2016-03-15 ENCOUNTER — Other Ambulatory Visit: Payer: Managed Care, Other (non HMO) | Admitting: Internal Medicine

## 2016-03-15 DIAGNOSIS — E7800 Pure hypercholesterolemia, unspecified: Secondary | ICD-10-CM

## 2016-03-15 DIAGNOSIS — Z131 Encounter for screening for diabetes mellitus: Secondary | ICD-10-CM

## 2016-03-15 DIAGNOSIS — R748 Abnormal levels of other serum enzymes: Secondary | ICD-10-CM

## 2016-03-15 LAB — HEPATIC FUNCTION PANEL
ALT: 19 U/L (ref 6–29)
AST: 17 U/L (ref 10–35)
Albumin: 4.2 g/dL (ref 3.6–5.1)
Alkaline Phosphatase: 122 U/L (ref 33–130)
BILIRUBIN DIRECT: 0.1 mg/dL (ref <=0.2)
BILIRUBIN TOTAL: 0.3 mg/dL (ref 0.2–1.2)
Indirect Bilirubin: 0.2 mg/dL (ref 0.2–1.2)
Total Protein: 7.2 g/dL (ref 6.1–8.1)

## 2016-03-15 LAB — LIPID PANEL
CHOL/HDL RATIO: 2.4 ratio (ref <=5.0)
Cholesterol: 208 mg/dL — ABNORMAL HIGH (ref 125–200)
HDL: 87 mg/dL (ref >=46)
LDL Cholesterol: 112 mg/dL (ref <130)
Triglycerides: 47 mg/dL (ref <150)
VLDL: 9 mg/dL (ref <30)

## 2016-03-15 LAB — HEMOGLOBIN A1C
HEMOGLOBIN A1C: 6 % — AB (ref <5.7)
Mean Plasma Glucose: 126 mg/dL

## 2016-05-23 ENCOUNTER — Other Ambulatory Visit: Payer: Self-pay | Admitting: Internal Medicine

## 2016-05-26 ENCOUNTER — Other Ambulatory Visit: Payer: Self-pay | Admitting: Internal Medicine

## 2016-05-26 NOTE — Telephone Encounter (Signed)
Refill x 6 months 

## 2016-06-22 ENCOUNTER — Encounter: Payer: Self-pay | Admitting: Internal Medicine

## 2016-06-22 ENCOUNTER — Ambulatory Visit (INDEPENDENT_AMBULATORY_CARE_PROVIDER_SITE_OTHER): Payer: Managed Care, Other (non HMO) | Admitting: Internal Medicine

## 2016-06-22 VITALS — BP 142/94 | HR 70 | Temp 98.4°F | Wt 165.5 lb

## 2016-06-22 DIAGNOSIS — I10 Essential (primary) hypertension: Secondary | ICD-10-CM | POA: Diagnosis not present

## 2016-06-22 DIAGNOSIS — F411 Generalized anxiety disorder: Secondary | ICD-10-CM | POA: Diagnosis not present

## 2016-06-22 DIAGNOSIS — M898X1 Other specified disorders of bone, shoulder: Secondary | ICD-10-CM | POA: Diagnosis not present

## 2016-06-22 MED ORDER — TRAMADOL HCL 50 MG PO TABS: ORAL_TABLET | ORAL | 0 refills | Status: DC

## 2016-06-22 MED ORDER — PREDNISONE 10 MG PO TABS: ORAL_TABLET | ORAL | 0 refills | Status: DC

## 2016-06-22 NOTE — Progress Notes (Signed)
   Subjective:    Patient ID: Nicole Bowers, female    DOB: 04-Mar-1956, 60 y.o.   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 Now complaining of pain in the left medial periscapular area. She's tried massage therapy but still has pain particularly at night. She is wondering about dry needling or acupuncture for therapy. She's tried over-the-counter NSAID without relief. She has a history of hypertension. History of anxiety.  Says for many years she's had numbness in her left fourth and fifth fingers. She relates this to a cervical neck problem. History of left wrist pain 2015 treated with 6 day prednisone dosepak. X-ray of left wrist at that time was normal.  She is employed as a Buyer, retaildog groomer and trainer.    Review of Systems see above     Objective:   Physical Exam She has a trigger point in the left medial scapular area about a third of the way down. Very tender to palpation. Similar area just above the out 1. Full range of motion of the left upper extremity and grip is normal.       Assessment & Plan:  Trigger point left scapular area  Plan: She does not tolerate hydrocodone. Have given her Sterapred DS 10 mg 6 day dosepak to try. We can do trigger point injections if she does not respond to the prednisone. Recommend ice and possibly a TENS unit. We will go to give her a small prescription for tramadol but apparently it interacts with her antidepressant. Do not take over-the-counter anti-inflammatory medications while taking prednisone. When she finishes prednisone may try meloxicam 15 mg daily with food.

## 2016-06-22 NOTE — Patient Instructions (Addendum)
Take Prednisone in tapering course as directed. Then start Meloxicam 15 mg daily with food. Consider a TENS unit. Still take BP med.

## 2016-07-17 ENCOUNTER — Other Ambulatory Visit: Payer: Self-pay | Admitting: Internal Medicine

## 2016-08-25 ENCOUNTER — Other Ambulatory Visit: Payer: Self-pay | Admitting: Internal Medicine

## 2016-11-23 ENCOUNTER — Other Ambulatory Visit: Payer: Self-pay | Admitting: Internal Medicine

## 2016-12-06 ENCOUNTER — Other Ambulatory Visit: Payer: Self-pay | Admitting: Internal Medicine

## 2016-12-06 NOTE — Telephone Encounter (Signed)
Refill x 90 days 

## 2017-01-23 ENCOUNTER — Other Ambulatory Visit: Payer: Self-pay | Admitting: Internal Medicine

## 2017-03-04 ENCOUNTER — Other Ambulatory Visit: Payer: Managed Care, Other (non HMO) | Admitting: Internal Medicine

## 2017-03-07 ENCOUNTER — Encounter: Payer: Managed Care, Other (non HMO) | Admitting: Internal Medicine

## 2017-03-21 ENCOUNTER — Ambulatory Visit: Payer: Managed Care, Other (non HMO) | Admitting: Internal Medicine

## 2017-03-21 ENCOUNTER — Encounter: Payer: Self-pay | Admitting: Internal Medicine

## 2017-03-21 VITALS — BP 128/88 | HR 69 | Temp 97.7°F | Ht 64.0 in | Wt 166.2 lb

## 2017-03-21 DIAGNOSIS — J0100 Acute maxillary sinusitis, unspecified: Secondary | ICD-10-CM

## 2017-03-21 DIAGNOSIS — I10 Essential (primary) hypertension: Secondary | ICD-10-CM | POA: Diagnosis not present

## 2017-03-21 DIAGNOSIS — R5383 Other fatigue: Secondary | ICD-10-CM

## 2017-03-21 DIAGNOSIS — H6501 Acute serous otitis media, right ear: Secondary | ICD-10-CM

## 2017-03-21 LAB — CBC WITH DIFFERENTIAL/PLATELET
Basophils Absolute: 0 cells/uL (ref 0–200)
Basophils Relative: 0 %
Eosinophils Absolute: 71 cells/uL (ref 15–500)
Eosinophils Relative: 1 %
HCT: 41.9 % (ref 35.0–45.0)
Hemoglobin: 13 g/dL (ref 11.7–15.5)
Lymphocytes Relative: 39 %
Lymphs Abs: 2769 cells/uL (ref 850–3900)
MCH: 26.1 pg — ABNORMAL LOW (ref 27.0–33.0)
MCHC: 31 g/dL — AB (ref 32.0–36.0)
MCV: 84 fL (ref 80.0–100.0)
MONOS PCT: 7 %
MPV: 9.5 fL (ref 7.5–12.5)
Monocytes Absolute: 497 cells/uL (ref 200–950)
NEUTROS PCT: 53 %
Neutro Abs: 3763 cells/uL (ref 1500–7800)
PLATELETS: 303 10*3/uL (ref 140–400)
RBC: 4.99 MIL/uL (ref 3.80–5.10)
RDW: 14.6 % (ref 11.0–15.0)
WBC: 7.1 10*3/uL (ref 3.8–10.8)

## 2017-03-21 MED ORDER — LEVOFLOXACIN 500 MG PO TABS: 500.0000 mg | ORAL_TABLET | Freq: Every day | ORAL | 0 refills | Status: DC

## 2017-03-21 MED ORDER — METHYLPREDNISOLONE ACETATE 80 MG/ML IJ SUSP
80.0000 mg | Freq: Once | INTRAMUSCULAR | Status: AC
  Administered 2017-03-21: 80 mg via INTRAMUSCULAR

## 2017-03-21 MED ORDER — FLUCONAZOLE 150 MG PO TABS: 150.0000 mg | ORAL_TABLET | Freq: Once | ORAL | 1 refills | Status: DC

## 2017-03-21 NOTE — Progress Notes (Signed)
   Subjective:    Patient ID: Nicole Bowers, female    DOB: 1956/02/23, 61 y.o.   MRN: [id number]  HPI 61 year old Female has had 2 tick bites recently. Found a tick attached to her right posterior scalp around June 9. At that time she was driving to [place] for a meeting and to visit her sister. She felt poorly. Had significant lymphadenopathy right occipital area. She went to an urgent care and was treated with 14 days of doxycycline. At the time she also was having considerable postnasal drip and husband  had noticed she was snoring which was unusual for her. Had low-grade fever of 99. No shaking chills. No rash. No headache. Urgent care physician also diagnosed her with sinusitis. She's finished almost the entire 14 day course of doxycycline but still has postnasal drip, malaise and fatigue.  History of essential hypertension which is stable.    Review of Systems see above- has fatigue     Objective:   Physical Exam  Constitutional: She appears well-developed and well-nourished. No distress.  HENT:  Head: Normocephalic and atraumatic.  Mouth/Throat: Oropharynx is clear and moist.  Right TM is full but not red. Left TM is normal.  Eyes: No scleral icterus.  Neck:  No occipital adenopathy  Cardiovascular: Normal rate, regular rhythm and normal heart sounds.   No murmur heard. Pulmonary/Chest: Effort normal and breath sounds normal. No respiratory distress. She has no wheezes.  Lymphadenopathy:    She has no cervical adenopathy.  Skin: Skin is warm and dry. No rash noted. She is not diaphoretic.  Psychiatric: She has a normal mood and affect. Her behavior is normal. Judgment and thought content normal.  Vitals reviewed.         Assessment & Plan:  2 tick bites recently-treated adequately with doxycycline for nearly 14 days  Right serous otitis media  Maxillary sinusitis  Plan: Levaquin 500 milligrams daily for 10 days. Stop doxycycline. Depo-Medrol 80 mg IM. CBC with  differential, Lyme and RMSF titers are pending. Rest and drink plenty of fluids.

## 2017-03-21 NOTE — Patient Instructions (Signed)
It was a pleasure to see you today. Lab work pending including CBC with differential, Lyme titers, are in an effort titers. Change from doxycycline to Levaquin 500 milligrams daily for 10 days. Depo-Medrol 80 mg IM given for postnasal drip/congestion. Call if not better in 10 days or sooner if worse.

## 2017-03-22 LAB — ROCKY MTN SPOTTED FVR ABS PNL(IGG+IGM)
RMSF IgG: DETECTED — AB
RMSF IgM: NOT DETECTED

## 2017-03-22 LAB — REFLEX RMSF IGG TITER: RMSF IgG Titer: 1:128 {titer} — ABNORMAL HIGH

## 2017-03-23 LAB — LYME ABY, WSTRN BLT IGG & IGM W/BANDS
B burgdorferi IgG Abs (IB): NEGATIVE
B burgdorferi IgM Abs (IB): NEGATIVE
LYME DISEASE 23 KD IGM: NONREACTIVE
LYME DISEASE 39 KD IGG: NONREACTIVE
LYME DISEASE 39 KD IGM: NONREACTIVE
LYME DISEASE 41 KD IGG: NONREACTIVE
LYME DISEASE 45 KD IGG: NONREACTIVE
LYME DISEASE 93 KD IGG: NONREACTIVE
Lyme Disease 18 kD IgG: NONREACTIVE
Lyme Disease 23 kD IgG: NONREACTIVE
Lyme Disease 28 kD IgG: NONREACTIVE
Lyme Disease 30 kD IgG: NONREACTIVE
Lyme Disease 41 kD IgM: NONREACTIVE
Lyme Disease 58 kD IgG: NONREACTIVE
Lyme Disease 66 kD IgG: NONREACTIVE

## 2017-04-08 ENCOUNTER — Other Ambulatory Visit: Payer: 59 | Admitting: Internal Medicine

## 2017-04-08 DIAGNOSIS — Z1321 Encounter for screening for nutritional disorder: Secondary | ICD-10-CM

## 2017-04-08 DIAGNOSIS — Z0000 Encounter for general adult medical examination without abnormal findings: Secondary | ICD-10-CM

## 2017-04-08 DIAGNOSIS — Z13220 Encounter for screening for lipoid disorders: Secondary | ICD-10-CM

## 2017-04-08 DIAGNOSIS — I10 Essential (primary) hypertension: Secondary | ICD-10-CM

## 2017-04-08 DIAGNOSIS — Z1329 Encounter for screening for other suspected endocrine disorder: Secondary | ICD-10-CM

## 2017-04-08 LAB — CBC WITH DIFFERENTIAL/PLATELET
BASOS PCT: 0 %
Basophils Absolute: 0 cells/uL (ref 0–200)
EOS ABS: 186 {cells}/uL (ref 15–500)
Eosinophils Relative: 3 %
HCT: 41.1 % (ref 35.0–45.0)
HEMOGLOBIN: 12.9 g/dL (ref 11.7–15.5)
LYMPHS ABS: 2294 {cells}/uL (ref 850–3900)
LYMPHS PCT: 37 %
MCH: 27 pg (ref 27.0–33.0)
MCHC: 31.4 g/dL — AB (ref 32.0–36.0)
MCV: 86.2 fL (ref 80.0–100.0)
MONO ABS: 496 {cells}/uL (ref 200–950)
MPV: 9.2 fL (ref 7.5–12.5)
Monocytes Relative: 8 %
NEUTROS PCT: 52 %
Neutro Abs: 3224 cells/uL (ref 1500–7800)
Platelets: 235 10*3/uL (ref 140–400)
RBC: 4.77 MIL/uL (ref 3.80–5.10)
RDW: 14.7 % (ref 11.0–15.0)
WBC: 6.2 10*3/uL (ref 3.8–10.8)

## 2017-04-08 LAB — TSH: TSH: 1.42 mIU/L

## 2017-04-09 LAB — LIPID PANEL
CHOLESTEROL: 197 mg/dL (ref <200)
HDL: 87 mg/dL (ref >50)
LDL Cholesterol: 101 mg/dL — ABNORMAL HIGH (ref <100)
Total CHOL/HDL Ratio: 2.3 Ratio (ref <5.0)
Triglycerides: 46 mg/dL (ref <150)
VLDL: 9 mg/dL (ref <30)

## 2017-04-09 LAB — COMPLETE METABOLIC PANEL WITH GFR
ALK PHOS: 122 U/L (ref 33–130)
ALT: 26 U/L (ref 6–29)
AST: 19 U/L (ref 10–35)
Albumin: 4.2 g/dL (ref 3.6–5.1)
BUN: 23 mg/dL (ref 7–25)
CALCIUM: 9.5 mg/dL (ref 8.6–10.4)
CHLORIDE: 104 mmol/L (ref 98–110)
CO2: 24 mmol/L (ref 20–31)
CREATININE: 0.76 mg/dL (ref 0.50–1.05)
GFR, Est African American: >89 mL/min (ref >=60)
GFR, Est Non African American: 86 mL/min (ref >=60)
Glucose, Bld: 104 mg/dL — ABNORMAL HIGH (ref 65–99)
Potassium: 5.4 mmol/L — ABNORMAL HIGH (ref 3.5–5.3)
Sodium: 139 mmol/L (ref 135–146)
Total Bilirubin: 0.3 mg/dL (ref 0.2–1.2)
Total Protein: 6.7 g/dL (ref 6.1–8.1)

## 2017-04-09 LAB — VITAMIN D 25 HYDROXY (VIT D DEFICIENCY, FRACTURES): Vit D, 25-Hydroxy: 45 ng/mL (ref 30–100)

## 2017-04-11 ENCOUNTER — Ambulatory Visit (INDEPENDENT_AMBULATORY_CARE_PROVIDER_SITE_OTHER): Payer: 59 | Admitting: Internal Medicine

## 2017-04-11 VITALS — BP 124/80 | HR 75 | Temp 97.1°F | Ht 64.0 in | Wt 162.0 lb

## 2017-04-11 DIAGNOSIS — I10 Essential (primary) hypertension: Secondary | ICD-10-CM

## 2017-04-11 DIAGNOSIS — R252 Cramp and spasm: Secondary | ICD-10-CM

## 2017-04-11 DIAGNOSIS — J3489 Other specified disorders of nose and nasal sinuses: Secondary | ICD-10-CM | POA: Diagnosis not present

## 2017-04-11 DIAGNOSIS — J309 Allergic rhinitis, unspecified: Secondary | ICD-10-CM

## 2017-04-11 DIAGNOSIS — H6503 Acute serous otitis media, bilateral: Secondary | ICD-10-CM

## 2017-04-11 DIAGNOSIS — R7301 Impaired fasting glucose: Secondary | ICD-10-CM | POA: Diagnosis not present

## 2017-04-11 DIAGNOSIS — Z0000 Encounter for general adult medical examination without abnormal findings: Secondary | ICD-10-CM

## 2017-04-11 DIAGNOSIS — Z114 Encounter for screening for human immunodeficiency virus [HIV]: Secondary | ICD-10-CM

## 2017-04-11 DIAGNOSIS — E875 Hyperkalemia: Secondary | ICD-10-CM | POA: Diagnosis not present

## 2017-04-11 DIAGNOSIS — Z1159 Encounter for screening for other viral diseases: Secondary | ICD-10-CM

## 2017-04-11 LAB — POCT URINALYSIS DIPSTICK
BILIRUBIN UA: NEGATIVE
Blood, UA: NEGATIVE
Glucose, UA: NEGATIVE
Ketones, UA: NEGATIVE
Leukocytes, UA: NEGATIVE
NITRITE UA: NEGATIVE
PH UA: 6 (ref 5.0–8.0)
Protein, UA: NEGATIVE
Spec Grav, UA: 1.015 (ref 1.010–1.025)
Urobilinogen, UA: 0.2 E.U./dL

## 2017-04-11 LAB — POTASSIUM: Potassium: 5.1 mmol/L (ref 3.5–5.3)

## 2017-04-11 MED ORDER — PREDNISONE 10 MG PO TABS: ORAL_TABLET | ORAL | 0 refills | Status: DC
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
## 2017-04-12 ENCOUNTER — Telehealth: Payer: Self-pay

## 2017-04-12 DIAGNOSIS — R7302 Impaired glucose tolerance (oral): Secondary | ICD-10-CM

## 2017-04-12 LAB — HIV ANTIBODY (ROUTINE TESTING W REFLEX): HIV 1&2 Ab, 4th Generation: NONREACTIVE

## 2017-04-12 LAB — HEMOGLOBIN A1C
Hgb A1c MFr Bld: 6 % — ABNORMAL HIGH (ref <5.7)
Mean Plasma Glucose: 126 mg/dL

## 2017-04-12 LAB — HEPATITIS C ANTIBODY: HCV Ab: NEGATIVE

## 2017-04-12 NOTE — Telephone Encounter (Signed)
Referral done

## 2017-04-12 NOTE — Telephone Encounter (Signed)
-----   Message from Margaree MackintoshMary J Baxley, MD sent at 04/12/2017 11:10 AM EDT ----- Pt has impaired glucose tolerance at 6%. Does she want to see dietician?

## 2017-04-23 ENCOUNTER — Encounter: Payer: Self-pay | Admitting: Internal Medicine

## 2017-04-23 NOTE — Patient Instructions (Signed)
Take prednisone in tapering course over 12 days as directed. Continue other medications as previously prescribed. Serum potassium rechecked today and is normal.  Patient indicates she would like to return in 2 weeks for follow-up of ear congestion and rhinorrhea

## 2017-04-25 ENCOUNTER — Encounter: Payer: PRIVATE HEALTH INSURANCE | Attending: Internal Medicine | Admitting: Registered"

## 2017-04-25 ENCOUNTER — Encounter: Payer: Self-pay | Admitting: Registered"

## 2017-04-25 DIAGNOSIS — Z713 Dietary counseling and surveillance: Secondary | ICD-10-CM | POA: Diagnosis not present

## 2017-04-25 DIAGNOSIS — R7303 Prediabetes: Secondary | ICD-10-CM

## 2017-04-25 DIAGNOSIS — R7302 Impaired glucose tolerance (oral): Secondary | ICD-10-CM | POA: Diagnosis not present

## 2017-04-25 NOTE — Patient Instructions (Addendum)
Plan:  Aim for 30-45 grams per meal include balance especially with protein  Aim for 0-20 Carbs per snack if hungry  Include protein in moderation with your meals and snacks Consider reading food labels for Total Carbohydrate  Ideas for better sleep:  Get a bedtime routine to help wind down  Avoid caffeine later in the day including coffee, soda, black tea, green tea, chocolate  Magnesium supplement in the evening may help- Calm anti-stress drink, take minimal amount to begin, can also help with constipation  Avoid watching TV or looking at the computer 1 hr before going to sleep  Optimal room temperature for sleep 60-67 degrees fahrenheit  Avoid light in bedroom while sleeping, room darkening curtains may help  More information can be found at [email or web address]://[email or web address]/see.[email or web address]
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
## 2017-04-26 ENCOUNTER — Ambulatory Visit (INDEPENDENT_AMBULATORY_CARE_PROVIDER_SITE_OTHER): Payer: 59 | Admitting: Internal Medicine

## 2017-04-26 ENCOUNTER — Encounter: Payer: Self-pay | Admitting: Internal Medicine

## 2017-04-26 VITALS — BP 110/70 | HR 70 | Temp 97.7°F | Wt 167.0 lb

## 2017-04-26 DIAGNOSIS — H6593 Unspecified nonsuppurative otitis media, bilateral: Secondary | ICD-10-CM | POA: Diagnosis not present

## 2017-04-26 DIAGNOSIS — I10 Essential (primary) hypertension: Secondary | ICD-10-CM

## 2017-04-26 DIAGNOSIS — R7302 Impaired glucose tolerance (oral): Secondary | ICD-10-CM

## 2017-04-26 NOTE — Patient Instructions (Signed)
Glucose intolerance discussed. Am pleased with her exercise regimen. Continue diet and exercise. Recheck hemoglobin A1c in 4 months.  See ENT physician regarding ear discomfort and maxillary sinus tenderness.
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
## 2017-04-27 ENCOUNTER — Other Ambulatory Visit: Payer: Self-pay | Admitting: Internal Medicine

## 2017-04-28 NOTE — Telephone Encounter (Signed)
Refill each x 6 months 

## 2017-05-02 ENCOUNTER — Other Ambulatory Visit: Payer: Self-pay | Admitting: Internal Medicine

## 2017-05-03 NOTE — Telephone Encounter (Signed)
Does she needs this?

## 2017-05-03 NOTE — Telephone Encounter (Signed)
Pt stated that she has the start of a yeast infection, and is requesting a refill

## 2017-06-20 DIAGNOSIS — J329 Chronic sinusitis, unspecified: Secondary | ICD-10-CM | POA: Insufficient documentation

## 2017-07-11 ENCOUNTER — Telehealth: Payer: Self-pay | Admitting: *Deleted

## 2017-07-11 DIAGNOSIS — M8580 Other specified disorders of bone density and structure, unspecified site: Secondary | ICD-10-CM

## 2017-07-11 NOTE — Telephone Encounter (Signed)
Yes, schedule Dexa now.
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
## 2017-07-11 NOTE — Telephone Encounter (Signed)
Pt will schedule at breast center.

## 2017-07-18 ENCOUNTER — Other Ambulatory Visit: Payer: Self-pay | Admitting: Obstetrics & Gynecology

## 2017-07-18 DIAGNOSIS — Z1231 Encounter for screening mammogram for malignant neoplasm of breast: Secondary | ICD-10-CM

## 2017-07-26 ENCOUNTER — Other Ambulatory Visit: Payer: Self-pay | Admitting: Internal Medicine

## 2017-08-01 DIAGNOSIS — H6980 Other specified disorders of Eustachian tube, unspecified ear: Secondary | ICD-10-CM | POA: Insufficient documentation

## 2017-08-08 ENCOUNTER — Encounter: Payer: Self-pay | Admitting: Obstetrics & Gynecology

## 2017-08-08 ENCOUNTER — Ambulatory Visit (INDEPENDENT_AMBULATORY_CARE_PROVIDER_SITE_OTHER): Payer: 59 | Admitting: Obstetrics & Gynecology

## 2017-08-08 VITALS — BP 144/90 | Ht 64.0 in | Wt 160.0 lb

## 2017-08-08 DIAGNOSIS — Z01419 Encounter for gynecological examination (general) (routine) without abnormal findings: Secondary | ICD-10-CM

## 2017-08-08 DIAGNOSIS — B9689 Other specified bacterial agents as the cause of diseases classified elsewhere: Secondary | ICD-10-CM | POA: Diagnosis not present

## 2017-08-08 DIAGNOSIS — Z780 Asymptomatic menopausal state: Secondary | ICD-10-CM | POA: Diagnosis not present

## 2017-08-08 DIAGNOSIS — N760 Acute vaginitis: Secondary | ICD-10-CM | POA: Diagnosis not present

## 2017-08-08 DIAGNOSIS — N898 Other specified noninflammatory disorders of vagina: Secondary | ICD-10-CM | POA: Diagnosis not present

## 2017-08-08 LAB — WET PREP FOR TRICH, YEAST, CLUE

## 2017-08-08 MED ORDER — TINIDAZOLE 500 MG PO TABS: 2.0000 g | ORAL_TABLET | Freq: Every day | ORAL | 0 refills | Status: AC

## 2017-08-08 NOTE — Progress Notes (Signed)
Nicole Bowers 01/31/1956 [id number]   History:    61 y.o. G2P2  Remarried x 3 years  RP:  Established patient presenting for annual gyn exam   HPI:  Has had a very stressful 2 last years with deaths in the family, her husband with Coronary Artery By-pass surgery and her going through Immune system issues after a Tick bite.  Per patient, cervical lymphadenopathy/sinusitis... On Corticosteroids and many Antibiotics.  During that period, she gained weight and was Prediabetic with HBA1C at 6.0.  Currently healthy, back to her normal weight, good nutrition/exercising.  Followed by Dr Lenord FellersBaxley.  Stable on Zoloft and Xanax.  Menopause.  No PMB.  No HRT.  No pelvic pain.  Mild vaginal d/c.  Was not sexually active in the past few months, but planning to resume now.  Breasts wnl.  Urine/BMs wnl.  Past medical history,surgical history, family history and social history were all reviewed and documented in the EPIC chart.  Gynecologic History Patient's last menstrual period was 07/23/2014 (approximate). Contraception: post menopausal status Last Pap: 10/2015. Results were: Negative/HPV HR neg Last mammogram: 10/2015. Results were: Benign.  Scheduled next week. Bone density 2015, scheduled next week. Colono 2009, repeat 2019.  Obstetric History OB History  Gravida Para Term Preterm AB Living  2 2       2   SAB TAB Ectopic Multiple Live Births               # Outcome Date GA Lbr Len/2nd Weight Sex Delivery Anes PTL Lv  2 Para           1 Para                ROS: A ROS was performed and pertinent positives and negatives are included in the history.  GENERAL: No fevers or chills. HEENT: No change in vision, no earache, sore throat or sinus congestion. NECK: No pain or stiffness. CARDIOVASCULAR: No chest pain or pressure. No palpitations. PULMONARY: No shortness of breath, cough or wheeze. GASTROINTESTINAL: No abdominal pain, nausea, vomiting or diarrhea, melena or bright red blood per rectum.  GENITOURINARY: No urinary frequency, urgency, hesitancy or dysuria. MUSCULOSKELETAL: No joint or muscle pain, no back pain, no recent trauma. DERMATOLOGIC: No rash, no itching, no lesions. ENDOCRINE: No polyuria, polydipsia, no heat or cold intolerance. No recent change in weight. HEMATOLOGICAL: No anemia or easy bruising or bleeding. NEUROLOGIC: No headache, seizures, numbness, tingling or weakness. PSYCHIATRIC: No depression, no loss of interest in normal activity or change in sleep pattern.     Exam:   BP (!) 144/90   Ht 5\' 4"  (1.626 m)   Wt 160 lb (72.6 kg)   LMP 07/23/2014 (Approximate)   BMI 27.46 kg/m   Body mass index is 27.46 kg/m.  General appearance : Well developed well nourished female. No acute distress HEENT: Eyes: no retinal hemorrhage or exudates,  Neck supple, trachea midline, no carotid bruits, no thyroidmegaly Lungs: Clear to auscultation, no rhonchi or wheezes, or rib retractions  Heart: Regular rate and rhythm, no murmurs or gallops Breast:Examined in sitting and supine position were symmetrical in appearance, no palpable masses or tenderness,  no skin retraction, no nipple inversion, no nipple discharge, no skin discoloration, no axillary or supraclavicular lymphadenopathy Abdomen: no palpable masses or tenderness, no rebound or guarding Extremities: no edema or skin discoloration or tenderness  Pelvic: Vulva normal  Bartholin, Urethra, Skene Glands: Within normal limits  Vagina: No gross lesions or discharge.  Increased vaginal d/c.  Wet prep done.  Cervix: No gross lesions or discharge  Uterus  AV, normal size, shape and consistency, non-tender and mobile  Adnexa  Without masses or tenderness  Anus and perineum  normal     Assessment/Plan:  61 y.o. female for annual exam   1. Well female exam with routine gynecological exam Gynecologic exam normal with mild increase in vaginal discharge.  Pap test negative with negative high-risk HPV February  2017.  Will repeat Pap test next year.  Breast exam normal.  Screening mammogram scheduled for next week.  Next colonoscopy in 2019.  Health labs with follow-up hemoglobin A1c with Dr. Lenord FellersBaxley.  2. Menopause present Menopause well-tolerated on no hormone replacement therapy.  No postmenopausal bleeding.  Will resume sexual activity and contact me if need local estrogen treatment.  Recommend Astroglide lubricant or coconut oil.  Vitamin D supplements, calcium rich diet and regular weightbearing physical activity recommended.  Bone density scheduled for next week.  3. Vaginal itching Has had episodes of yeast vaginitis.  No current yeast on wet prep.  4. Bacterial vaginosis Wet prep showing bacterial vaginosis.  Will treat with tinidazole.  Usage reviewed.  Patient has fluconazole tablets at home which she will take post antibiotic as needed for yeast vaginitis.  Counseling on above issues >50% x 10 minutes.  Genia DelMarie-Lyne Rosaleigh Brazzel MD, 4:30 PM 08/08/2017

## 2017-08-09 NOTE — Patient Instructions (Signed)
1. Well female exam with routine gynecological exam Gynecologic exam normal with mild increase in vaginal discharge.  Pap test negative with negative high-risk HPV February 2017.  Will repeat Pap test next year.  Breast exam normal.  Screening mammogram scheduled for next week.  Next colonoscopy in 2019.  Health labs with follow-up hemoglobin A1c with Dr. Renold Genta.  2. Menopause present Menopause well-tolerated on no hormone replacement therapy.  No postmenopausal bleeding.  Will resume sexual activity and contact me if need local estrogen treatment.  Recommend Astroglide lubricant or coconut oil.  Vitamin D supplements, calcium rich diet and regular weightbearing physical activity recommended.  Bone density scheduled for next week.  3. Vaginal itching Has had episodes of yeast vaginitis.  No current yeast on wet prep.  4. Bacterial vaginosis Wet prep showing bacterial vaginosis.  Will treat with tinidazole.  Usage reviewed.  Patient has fluconazole tablets at home which she will take post antibiotic as needed for yeast vaginitis.  Nicole Bowers, it was a pleasure seeing you today.  I will review your screening mammogram and bone density when available.   Health Maintenance for Postmenopausal Women Menopause is a normal process in which your reproductive ability comes to an end. This process happens gradually over a span of months to years, usually between the ages of 36 and 65. Menopause is complete when you have missed 12 consecutive menstrual periods. It is important to talk with your health care provider about some of the most common conditions that affect postmenopausal women, such as heart disease, cancer, and bone loss (osteoporosis). Adopting a healthy lifestyle and getting preventive care can help to promote your health and wellness. Those actions can also lower your chances of developing some of these common conditions. What should I know about menopause? During menopause, you may experience a  number of symptoms, such as:  Moderate-to-severe hot flashes.  Night sweats.  Decrease in sex drive.  Mood swings.  Headaches.  Tiredness.  Irritability.  Memory problems.  Insomnia.  Choosing to treat or not to treat menopausal changes is an individual decision that you make with your health care provider. What should I know about hormone replacement therapy and supplements? Hormone therapy products are effective for treating symptoms that are associated with menopause, such as hot flashes and night sweats. Hormone replacement carries certain risks, especially as you become older. If you are thinking about using estrogen or estrogen with progestin treatments, discuss the benefits and risks with your health care provider. What should I know about heart disease and stroke? Heart disease, heart attack, and stroke become more likely as you age. This may be due, in part, to the hormonal changes that your body experiences during menopause. These can affect how your body processes dietary fats, triglycerides, and cholesterol. Heart attack and stroke are both medical emergencies. There are many things that you can do to help prevent heart disease and stroke:  Have your blood pressure checked at least every 1-2 years. High blood pressure causes heart disease and increases the risk of stroke.  If you are 45-48 years old, ask your health care provider if you should take aspirin to prevent a heart attack or a stroke.  Do not use any tobacco products, including cigarettes, chewing tobacco, or electronic cigarettes. If you need help quitting, ask your health care provider.  It is important to eat a healthy diet and maintain a healthy weight. ? Be sure to include plenty of vegetables, fruits, low-fat dairy products, and lean protein. ?  Avoid eating foods that are high in solid fats, added sugars, or salt (sodium).  Get regular exercise. This is one of the most important things that you can do  for your health. ? Try to exercise for at least 150 minutes each week. The type of exercise that you do should increase your heart rate and make you sweat. This is known as moderate-intensity exercise. ? Try to do strengthening exercises at least twice each week. Do these in addition to the moderate-intensity exercise.  Know your numbers.Ask your health care provider to check your cholesterol and your blood glucose. Continue to have your blood tested as directed by your health care provider.  What should I know about cancer screening? There are several types of cancer. Take the following steps to reduce your risk and to catch any cancer development as early as possible. Breast Cancer  Practice breast self-awareness. ? This means understanding how your breasts normally appear and feel. ? It also means doing regular breast self-exams. Let your health care provider know about any changes, no matter how small.  If you are 29 or older, have a clinician do a breast exam (clinical breast exam or CBE) every year. Depending on your age, family history, and medical history, it may be recommended that you also have a yearly breast X-ray (mammogram).  If you have a family history of breast cancer, talk with your health care provider about genetic screening.  If you are at high risk for breast cancer, talk with your health care provider about having an MRI and a mammogram every year.  Breast cancer (BRCA) gene test is recommended for women who have family members with BRCA-related cancers. Results of the assessment will determine the need for genetic counseling and BRCA1 and for BRCA2 testing. BRCA-related cancers include these types: ? Breast. This occurs in males or females. ? Ovarian. ? Tubal. This may also be called fallopian tube cancer. ? Cancer of the abdominal or pelvic lining (peritoneal cancer). ? Prostate. ? Pancreatic.  Cervical, Uterine, and Ovarian Cancer Your health care provider may  recommend that you be screened regularly for cancer of the pelvic organs. These include your ovaries, uterus, and vagina. This screening involves a pelvic exam, which includes checking for microscopic changes to the surface of your cervix (Pap test).  For women ages 21-65, health care providers may recommend a pelvic exam and a Pap test every three years. For women ages 54-65, they may recommend the Pap test and pelvic exam, combined with testing for human papilloma virus (HPV), every five years. Some types of HPV increase your risk of cervical cancer. Testing for HPV may also be done on women of any age who have unclear Pap test results.  Other health care providers may not recommend any screening for nonpregnant women who are considered low risk for pelvic cancer and have no symptoms. Ask your health care provider if a screening pelvic exam is right for you.  If you have had past treatment for cervical cancer or a condition that could lead to cancer, you need Pap tests and screening for cancer for at least 20 years after your treatment. If Pap tests have been discontinued for you, your risk factors (such as having a new sexual partner) need to be reassessed to determine if you should start having screenings again. Some women have medical problems that increase the chance of getting cervical cancer. In these cases, your health care provider may recommend that you have screening and Pap  tests more often.  If you have a family history of uterine cancer or ovarian cancer, talk with your health care provider about genetic screening.  If you have vaginal bleeding after reaching menopause, tell your health care provider.  There are currently no reliable tests available to screen for ovarian cancer.  Lung Cancer Lung cancer screening is recommended for adults 72-17 years old who are at high risk for lung cancer because of a history of smoking. A yearly low-dose CT scan of the lungs is recommended if  you:  Currently smoke.  Have a history of at least 30 pack-years of smoking and you currently smoke or have quit within the past 15 years. A pack-year is smoking an average of one pack of cigarettes per day for one year.  Yearly screening should:  Continue until it has been 15 years since you quit.  Stop if you develop a health problem that would prevent you from having lung cancer treatment.  Colorectal Cancer  This type of cancer can be detected and can often be prevented.  Routine colorectal cancer screening usually begins at age 9 and continues through age 92.  If you have risk factors for colon cancer, your health care provider may recommend that you be screened at an earlier age.  If you have a family history of colorectal cancer, talk with your health care provider about genetic screening.  Your health care provider may also recommend using home test kits to check for hidden blood in your stool.  A small camera at the end of a tube can be used to examine your colon directly (sigmoidoscopy or colonoscopy). This is done to check for the earliest forms of colorectal cancer.  Direct examination of the colon should be repeated every 5-10 years until age 3. However, if early forms of precancerous polyps or small growths are found or if you have a family history or genetic risk for colorectal cancer, you may need to be screened more often.  Skin Cancer  Check your skin from head to toe regularly.  Monitor any moles. Be sure to tell your health care provider: ? About any new moles or changes in moles, especially if there is a change in a mole's shape or color. ? If you have a mole that is larger than the size of a pencil eraser.  If any of your family members has a history of skin cancer, especially at a young age, talk with your health care provider about genetic screening.  Always use sunscreen. Apply sunscreen liberally and repeatedly throughout the day.  Whenever you are  outside, protect yourself by wearing long sleeves, pants, a wide-brimmed hat, and sunglasses.  What should I know about osteoporosis? Osteoporosis is a condition in which bone destruction happens more quickly than new bone creation. After menopause, you may be at an increased risk for osteoporosis. To help prevent osteoporosis or the bone fractures that can happen because of osteoporosis, the following is recommended:  If you are 37-25 years old, get at least 1,000 mg of calcium and at least 600 mg of vitamin D per day.  If you are older than age 55 but younger than age 60, get at least 1,200 mg of calcium and at least 600 mg of vitamin D per day.  If you are older than age 72, get at least 1,200 mg of calcium and at least 800 mg of vitamin D per day.  Smoking and excessive alcohol intake increase the risk of osteoporosis. Eat  foods that are rich in calcium and vitamin D, and do weight-bearing exercises several times each week as directed by your health care provider. What should I know about how menopause affects my mental health? Depression may occur at any age, but it is more common as you become older. Common symptoms of depression include:  Low or sad mood.  Changes in sleep patterns.  Changes in appetite or eating patterns.  Feeling an overall lack of motivation or enjoyment of activities that you previously enjoyed.  Frequent crying spells.  Talk with your health care provider if you think that you are experiencing depression. What should I know about immunizations? It is important that you get and maintain your immunizations. These include:  Tetanus, diphtheria, and pertussis (Tdap) booster vaccine.  Influenza every year before the flu season begins.  Pneumonia vaccine.  Shingles vaccine.  Your health care provider may also recommend other immunizations. This information is not intended to replace advice given to you by your health care provider. Make sure you discuss  any questions you have with your health care provider. Document Released: 11/05/2005 Document Revised: 04/02/2016 Document Reviewed: 06/17/2015 Elsevier Interactive Patient Education  2018 [employer].

## 2017-08-15 ENCOUNTER — Ambulatory Visit
Admission: RE | Admit: 2017-08-15 | Discharge: 2017-08-15 | Disposition: A | Discharge status: Home / Self Care | Payer: 59 | Source: Ambulatory Visit | Attending: Obstetrics & Gynecology | Admitting: Obstetrics & Gynecology

## 2017-08-15 DIAGNOSIS — Z1231 Encounter for screening mammogram for malignant neoplasm of breast: Secondary | ICD-10-CM

## 2017-08-15 DIAGNOSIS — M8580 Other specified disorders of bone density and structure, unspecified site: Secondary | ICD-10-CM

## 2017-08-17 ENCOUNTER — Other Ambulatory Visit (INDEPENDENT_AMBULATORY_CARE_PROVIDER_SITE_OTHER): Payer: 59 | Admitting: Internal Medicine

## 2017-08-17 DIAGNOSIS — R7302 Impaired glucose tolerance (oral): Secondary | ICD-10-CM | POA: Diagnosis not present

## 2017-08-18 LAB — HEMOGLOBIN A1C
Hgb A1c MFr Bld: 5.6 % of total Hgb (ref <5.7)
MEAN PLASMA GLUCOSE: 114 (calc)
eAG (mmol/L): 6.3 (calc)

## 2017-08-18 LAB — TIQ-NTM

## 2017-08-22 ENCOUNTER — Ambulatory Visit: Payer: 59 | Admitting: Internal Medicine

## 2017-08-23 ENCOUNTER — Telehealth: Payer: Self-pay | Admitting: *Deleted

## 2017-08-23 ENCOUNTER — Encounter: Payer: Self-pay | Admitting: Internal Medicine

## 2017-08-23 ENCOUNTER — Ambulatory Visit: Payer: 59 | Admitting: Internal Medicine

## 2017-08-23 VITALS — BP 120/80 | HR 68 | Ht 64.0 in | Wt 158.2 lb

## 2017-08-23 DIAGNOSIS — F411 Generalized anxiety disorder: Secondary | ICD-10-CM | POA: Diagnosis not present

## 2017-08-23 DIAGNOSIS — M533 Sacrococcygeal disorders, not elsewhere classified: Secondary | ICD-10-CM | POA: Diagnosis not present

## 2017-08-23 DIAGNOSIS — M8580 Other specified disorders of bone density and structure, unspecified site: Secondary | ICD-10-CM

## 2017-08-23 DIAGNOSIS — I10 Essential (primary) hypertension: Secondary | ICD-10-CM | POA: Diagnosis not present

## 2017-08-23 MED ORDER — IBANDRONATE SODIUM 150 MG PO TABS: 150.0000 mg | ORAL_TABLET | ORAL | 1 refills | Status: DC

## 2017-08-23 NOTE — Telephone Encounter (Signed)
Patient received her results from [facility] "Osteopenia with TScore -1.8. Vit D supplement 1,000 units , Ca++ in nutrition, weight bearing physical activity.   Patient said her lower back/ left hip has been hurting lately, she is a Research officer, political partydog breeder, very active, walks 7,000 steps a day. Pt said she has been taking vitamin d 2000 units for years, I did explained you recommended 1,000 units of vitamin d.  Patient wants to make sure you reviewed imaging from [facility]. Pt states she wants to be "proactive"

## 2017-08-25 ENCOUNTER — Encounter: Payer: Self-pay | Admitting: *Deleted

## 2017-08-25 NOTE — Telephone Encounter (Signed)
Sent mychart message

## 2017-08-25 NOTE — Telephone Encounter (Signed)
Please inform patient that Osteopenia doesn't cause pain and that a Bone Density is not the right Radiologic exam to investigate her symptoms.  I can refer to an Orthopedist to investigate her joint pains.  As for the Vit D, given that she has done well on 2000 IU of Vit D, I recommend to continue at that dosage.  But I don't see a Vit D level in her chart, so to be certain, please have her come in to do a Vit D level.  Ca++ was good 03/2017.  Thanks.

## 2017-08-26 NOTE — Progress Notes (Signed)
   Subjective:    Patient ID: Nicole Bowers, female    DOB: 04/02/1956, 61 y.o.   MRN: [id number]  HPI 61 year old Female with a discuss recent bone density study.  T score at left femur was -1.8 and in the LS spine -1.2.  Radiologist felt that she had lost significant bone density compared to 2015.  We discussed oral treatment with bisphosphonates.  She does have musculoskeletal pain in her hips.  History of hypertension  History of anxiety  Takes ibuprofen 800 mg as needed for musculoskeletal pain  Take Zoloft for anxiety  History of impaired glucose tolerance which has improved with diet.  4 months ago ,A1c was 6 and is now 5.6%.  Review of Systems see above-she does have pain in her sacral area particularly on the left     Objective:   Physical Exam  No significant pain with internal and external rotation of hips.  Tender over left posterior superior iliac spine      Assessment & Plan:  Osteopenia-trial of Boniva monthly  Impaired glucose tolerance-improved now to normal with diet and exercise  Hypertension-stable  Anxiety  Musculoskeletal pain left sacral area treat with ibuprofen  Plan: She had physical exam in July.  She can return next July for physical exam.  25 minutes spent with patient

## 2017-08-26 NOTE — Patient Instructions (Signed)
Trial of Boniva for osteopenia.  Continue same medications and return in July 2019 or sooner

## 2017-08-29 ENCOUNTER — Ambulatory Visit: Payer: 59 | Admitting: Internal Medicine

## 2017-10-16 NOTE — Progress Notes (Signed)
Nicole Bowers D.O. [facility] [address] [place], [place] [address] Phone: [phone] Subjective:    I'm seeing this patient by the request  of:  Baxley, Luanna ColeMary J, MD   CC: Knee pain  OZH:[id number]:Subjective  Nicole MaskerMegan Bowers is a 62 y.o. female coming in with complaint of pain.  Right knee.  Patient states that she has had pain for 2 months. Patient shows dogs for a living. This requires her to have to knee directly onto the patella. She has pain around the inferior patella. She does have pain to palpation but has sharp pain with weight bearing on her knee. She does have some achiness at rest. No history of knee surgery. 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     Past Medical History:  Diagnosis Date  . Anxiety   . Hypertension   . Migraine headache    Past Surgical History:  Procedure Laterality Date  . CESAREAN SECTION  1987  . CRYOABLATION    . ENDOMETRIAL ABLATION  2008   Social History   Socioeconomic History  . Marital status: Widowed    Spouse name: Not on file  . Number of children: Not on file  . Years of education: Not on file  . Highest education level: Not on file  Social Needs  . Financial resource strain: Not on file  . Food insecurity - worry: Not on file  . Food insecurity - inability: Not on file  . Transportation needs - medical: Not on file  . Transportation needs - non-medical: Not on file  Occupational History  . Not on file  Tobacco Use  . Smoking status: Former Smoker    Types: Cigarettes    Last attempt to quit: 08/09/2015    Years since quitting: 2.1  . Smokeless tobacco: Never Used  Substance and Sexual Activity  . Alcohol use: Yes    Alcohol/week: 0.0 oz    Comment: occ  . Drug use: No  . Sexual activity: Not Currently    Partners: Male    Comment: 1st intercourse- 8118, partners- 4620, married- 7 yrs   Other Topics Concern  . Not on file  Social History Narrative  . Not on file     Allergies  Allergen Reactions  . Demerol Other (See Comments)    Causes blackouts  . Codeine     REACTION: nausea, vomiting  . Effexor [Venlafaxine Hydrochloride]   . Sulfonamide Derivatives     REACTION: hives, itching  . Celexa [Citalopram Hydrobromide] Anxiety   Family History  Problem Relation Age of Onset  . Cancer Mother        squamos cell  . Hypertension Mother   . Cancer Father        prostate  . Heart failure Father   . Hypertension Father   . Cancer Maternal Aunt        squamos cell  . Heart failure Maternal Grandfather   . Heart attack Maternal Grandfather      Past medical history, social, surgical and family history all reviewed in electronic medical record.  No pertanent information unless stated regarding to the chief complaint.   Review of Systems:Review of systems updated and as accurate as of 10/16/17  No headache, visual changes, nausea, vomiting, diarrhea, constipation, dizziness, abdominal pain, skin rash, fevers, chills, night sweats, weight loss, swollen lymph nodes, body aches, joint swelling, chest pain, shortness of breath, mood changes.  Positive  muscle aches  Objective  Last menstrual period 07/23/2014. Systems examined below as of 10/16/17   General: No apparent distress alert and oriented x3 mood and affect normal, dressed appropriately.  HEENT: Pupils equal, extraocular movements intact  Respiratory: Patient's speak in full sentences and does not appear short of breath  Cardiovascular: No lower extremity edema, non tender, no erythema  Skin: Warm dry intact with no signs of infection or rash on extremities or on axial skeleton.  Abdomen: Soft nontender  Neuro: Cranial nerves II through XII are intact, neurovascularly intact in all extremities with 2+ DTRs and 2+ pulses.  Lymph: No lymphadenopathy of posterior or anterior cervical chain or axillae bilaterally.  Gait normal with good balance and coordination.  MSK:  Non tender with full  range of motion and good stability and symmetric strength and tone of shoulders, elbows, wrist, hip, and ankles bilaterally.  Knee: Right Normal to inspection with no erythema or effusion or obvious bony abnormalities. Palpation normal with no warmth, joint line tenderness, patellar tenderness, or condyle tenderness.  Very minimal pain palpation on the inferior aspect of the patella. ROM full in flexion and extension and lower leg rotation. Ligaments with solid consistent endpoints including ACL, PCL, LCL, MCL. Negative Mcmurray's, Apley's, and Thessalonian tests. Non painful patellar compression. Patellar glide without crepitus. Patellar and quadriceps tendons unremarkable. Hamstring and quadriceps strength is normal. Contralateral knee unremarkable  MSK US performed of: Right knee This study was ordered, performed, and interpreted by Terrilee Files D.O.  Knee: Calcific change of the inferior aspect of the patella.  Seems to be either a calcific bursitis or potential bone spur.  Wrist and knee exam is unremarkable.   IMPRESSION: Ossific change is inferior to the proximal aspect of the patella tendon   Impression and Recommendations:     This case required medical decision making of moderate complexity.      Note: This dictation was prepared with Dragon dictation along with smaller phrase technology. Any transcriptional errors that result from this process are unintentional.

## 2017-10-17 ENCOUNTER — Encounter: Payer: Self-pay | Admitting: Family Medicine

## 2017-10-17 ENCOUNTER — Ambulatory Visit (INDEPENDENT_AMBULATORY_CARE_PROVIDER_SITE_OTHER): Payer: PRIVATE HEALTH INSURANCE | Admitting: Family Medicine

## 2017-10-17 ENCOUNTER — Ambulatory Visit: Payer: Self-pay

## 2017-10-17 VITALS — BP 122/82 | HR 76 | Ht 64.0 in | Wt 163.0 lb

## 2017-10-17 DIAGNOSIS — M25561 Pain in right knee: Secondary | ICD-10-CM | POA: Diagnosis not present

## 2017-10-17 DIAGNOSIS — G8929 Other chronic pain: Secondary | ICD-10-CM | POA: Diagnosis not present

## 2017-10-17 MED ORDER — VITAMIN D (ERGOCALCIFEROL) 1.25 MG (50000 UNIT) PO CAPS: 50000.0000 [IU] | ORAL_CAPSULE | ORAL | 0 refills | Status: DC

## 2017-10-17 NOTE — Patient Instructions (Addendum)
Good to see you  Try the brace with a lot of activity  Once weekly vitamin D for 12 weeks  It looks to be a calfic change that occurred after the injury and just been there I think it may take some time but should do well overall  See me again in 3-4 weeks

## 2017-10-17 NOTE — Assessment & Plan Note (Signed)
Patient does have more knee pain.  Seems to be a calcific change on the inferior aspect of the patella. Believe that this is likely contributing to the pain patient has.  Given a brace to try to see if this would avoid as much pain.  Once weekly vitamin D given his with patient having a history of osteopenia.  Discussed icing regimen.  Follow-up with me again in 4 weeks to make sure it is not resolving.

## 2017-10-24 ENCOUNTER — Other Ambulatory Visit: Payer: Self-pay | Admitting: Internal Medicine

## 2017-10-25 ENCOUNTER — Other Ambulatory Visit: Payer: Self-pay | Admitting: Internal Medicine

## 2017-10-26 ENCOUNTER — Other Ambulatory Visit: Payer: Self-pay | Admitting: Internal Medicine

## 2017-10-26 NOTE — Telephone Encounter (Signed)
Call in #45 tabs with 5 refills: Take one half tab in am and one po hs

## 2017-11-07 ENCOUNTER — Ambulatory Visit: Payer: PRIVATE HEALTH INSURANCE | Admitting: Family Medicine

## 2017-11-16 NOTE — Progress Notes (Signed)
Nicole ScaleZach Khristian Bowers D.O. [facility] [address] [place], [place] [address] Phone: [phone] Subjective:     CC: Knee pain follow-up  ION:[id number]:Subjective  Nicole MaskerMegan Bowers is a 62 y.o. female coming in with complaint of knee pain.  Patient did have calcific changes of the inferior states that she is 95% better.  Doing vitamin D, wear the brace, doing the exercises occasionally.  Very happy with the results.       Past Medical History:  Diagnosis Date  . Anxiety   . Hypertension   . Migraine headache    Past Surgical History:  Procedure Laterality Date  . CESAREAN SECTION  1987  . CRYOABLATION    . ENDOMETRIAL ABLATION  2008   Social History   Socioeconomic History  . Marital status: Widowed    Spouse name: None  . Number of children: None  . Years of education: None  . Highest education level: None  Social Needs  . Financial resource strain: None  . Food insecurity - worry: None  . Food insecurity - inability: None  . Transportation needs - medical: None  . Transportation needs - non-medical: None  Occupational History  . None  Tobacco Use  . Smoking status: Former Smoker    Types: Cigarettes    Last attempt to quit: 08/09/2015    Years since quitting: 2.2  . Smokeless tobacco: Never Used  Substance and Sexual Activity  . Alcohol use: Yes    Alcohol/week: 0.0 oz    Comment: occ  . Drug use: No  . Sexual activity: Not Currently    Partners: Male    Comment: 1st intercourse- 3518, partners- 5720, married- 7 yrs   Other Topics Concern  . None  Social History Narrative  . None   Allergies  Allergen Reactions  . Demerol Other (See Comments)    Causes blackouts  . Codeine     REACTION: nausea, vomiting  . Effexor [Venlafaxine Hydrochloride]   . Sulfonamide Derivatives     REACTION: hives, itching  . Celexa [Citalopram Hydrobromide] Anxiety   Family History  Problem Relation Age of Onset  . Cancer Mother        squamos cell  . Hypertension Mother   .  Cancer Father        prostate  . Heart failure Father   . Hypertension Father   . Cancer Maternal Aunt        squamos cell  . Heart failure Maternal Grandfather   . Heart attack Maternal Grandfather      Past medical history, social, surgical and family history all reviewed in electronic medical record.  No pertanent information unless stated regarding to the chief complaint.   Review of Systems:Review of systems updated and as accurate as of 11/17/17  No headache, visual changes, nausea, vomiting, diarrhea, constipation, dizziness, abdominal pain, skin rash, fevers, chills, night sweats, weight loss, swollen lymph nodes, body aches, joint swelling, muscle aches, chest pain, shortness of breath, mood changes.   Objective  Blood pressure (!) 142/70, pulse 78, height 5\' 4"  (1.626 m), weight 162 lb (73.5 kg), last menstrual period 07/23/2014, SpO2 98 %. Systems examined below as of 11/17/17   General: No apparent distress alert and oriented x3 mood and affect normal, dressed appropriately.  HEENT: Pupils equal, extraocular movements intact  Respiratory: Patient's speak in full sentences and does not appear short of breath  Cardiovascular: No lower extremity edema, non tender, no erythema  Skin: Warm dry intact with no  signs of infection or rash on extremities or on axial skeleton.  Abdomen: Soft nontender  Neuro: Cranial nerves II through XII are intact, neurovascularly intact in all extremities with 2+ DTRs and 2+ pulses.  Lymph: No lymphadenopathy of posterior or anterior cervical chain or axillae bilaterally.  Gait normal with good balance and coordination.  MSK:  Non tender with full range of motion and good stability and symmetric strength and tone of shoulders, elbows, wrist, hip, and ankles bilaterally.  Knee: Normal to inspection with no erythema or effusion or obvious bony abnormalities. Palpation normal with no warmth, joint line tenderness, patellar tenderness, or condyle  tenderness. ROM full in flexion and extension and lower leg rotation. Ligaments with solid consistent endpoints including ACL, PCL, LCL, MCL. Negative Mcmurray's, Apley's, and Thessalonian tests. Non painful patellar compression. Patellar glide without crepitus. Patellar and quadriceps tendons unremarkable. Hamstring and quadriceps strength is normal.    Impression and Recommendations:     This case required medical decision making of moderate complexity.      Note: This dictation was prepared with Dragon dictation along with smaller phrase technology. Any transcriptional errors that result from this process are unintentional.

## 2017-11-17 ENCOUNTER — Encounter: Payer: Self-pay | Admitting: Family Medicine

## 2017-11-17 ENCOUNTER — Ambulatory Visit (INDEPENDENT_AMBULATORY_CARE_PROVIDER_SITE_OTHER): Payer: PRIVATE HEALTH INSURANCE | Admitting: Family Medicine

## 2017-11-17 DIAGNOSIS — M25561 Pain in right knee: Secondary | ICD-10-CM

## 2017-11-17 DIAGNOSIS — G8929 Other chronic pain: Secondary | ICD-10-CM | POA: Diagnosis not present

## 2017-11-17 NOTE — Assessment & Plan Note (Signed)
Patient is doing remarkably better at this time.  No changes in management.  As long as patient is welcome follow-up as needed.

## 2017-12-28 ENCOUNTER — Other Ambulatory Visit: Payer: Self-pay | Admitting: Family Medicine

## 2018-02-20 ENCOUNTER — Other Ambulatory Visit: Payer: Self-pay | Admitting: Internal Medicine

## 2018-03-16 ENCOUNTER — Encounter: Payer: Self-pay | Admitting: Gastroenterology

## 2018-03-20 ENCOUNTER — Other Ambulatory Visit: Payer: Self-pay | Admitting: Family Medicine

## 2018-03-21 NOTE — Telephone Encounter (Signed)
Refill done.  

## 2018-04-13 ENCOUNTER — Other Ambulatory Visit: Payer: 59 | Admitting: Internal Medicine

## 2018-04-17 ENCOUNTER — Encounter: Payer: 59 | Admitting: Internal Medicine

## 2018-04-22 ENCOUNTER — Other Ambulatory Visit: Payer: Self-pay | Admitting: Internal Medicine

## 2018-04-23 ENCOUNTER — Other Ambulatory Visit: Payer: Self-pay | Admitting: Internal Medicine

## 2018-05-03 ENCOUNTER — Encounter: Payer: Self-pay | Admitting: Internal Medicine

## 2018-05-03 ENCOUNTER — Other Ambulatory Visit: Payer: Self-pay | Admitting: Internal Medicine

## 2018-05-03 DIAGNOSIS — M8580 Other specified disorders of bone density and structure, unspecified site: Secondary | ICD-10-CM

## 2018-05-03 DIAGNOSIS — I10 Essential (primary) hypertension: Secondary | ICD-10-CM

## 2018-05-03 DIAGNOSIS — Z0000 Encounter for general adult medical examination without abnormal findings: Secondary | ICD-10-CM

## 2018-05-03 DIAGNOSIS — R7302 Impaired glucose tolerance (oral): Secondary | ICD-10-CM

## 2018-05-03 DIAGNOSIS — E7800 Pure hypercholesterolemia, unspecified: Secondary | ICD-10-CM

## 2018-05-03 NOTE — Progress Notes (Signed)
Error, 

## 2018-05-12 ENCOUNTER — Other Ambulatory Visit: Payer: 59 | Admitting: Internal Medicine

## 2018-05-12 DIAGNOSIS — M8580 Other specified disorders of bone density and structure, unspecified site: Secondary | ICD-10-CM

## 2018-05-12 DIAGNOSIS — Z0000 Encounter for general adult medical examination without abnormal findings: Secondary | ICD-10-CM

## 2018-05-12 DIAGNOSIS — I10 Essential (primary) hypertension: Secondary | ICD-10-CM

## 2018-05-12 DIAGNOSIS — R7302 Impaired glucose tolerance (oral): Secondary | ICD-10-CM

## 2018-05-12 DIAGNOSIS — E7800 Pure hypercholesterolemia, unspecified: Secondary | ICD-10-CM

## 2018-05-13 LAB — COMPLETE METABOLIC PANEL WITH GFR
AG Ratio: 1.7 (calc) (ref 1.0–2.5)
ALKALINE PHOSPHATASE (APISO): 102 U/L (ref 33–130)
ALT: 27 U/L (ref 6–29)
AST: 21 U/L (ref 10–35)
Albumin: 4.3 g/dL (ref 3.6–5.1)
BILIRUBIN TOTAL: 0.2 mg/dL (ref 0.2–1.2)
BUN: 22 mg/dL (ref 7–25)
CHLORIDE: 104 mmol/L (ref 98–110)
CO2: 28 mmol/L (ref 20–32)
CREATININE: 0.77 mg/dL (ref 0.50–0.99)
Calcium: 9.9 mg/dL (ref 8.6–10.4)
GFR, Est African American: 97 mL/min/{1.73_m2} (ref >=60)
GFR, Est Non African American: 84 mL/min/{1.73_m2} (ref >=60)
GLUCOSE: 117 mg/dL — AB (ref 65–99)
Globulin: 2.6 g/dL (calc) (ref 1.9–3.7)
Potassium: 4.9 mmol/L (ref 3.5–5.3)
Sodium: 140 mmol/L (ref 135–146)
Total Protein: 6.9 g/dL (ref 6.1–8.1)

## 2018-05-13 LAB — LIPID PANEL
CHOL/HDL RATIO: 2.4 (calc) (ref <5.0)
Cholesterol: 196 mg/dL (ref <200)
HDL: 81 mg/dL (ref >50)
LDL CHOLESTEROL (CALC): 101 mg/dL — AB
Non-HDL Cholesterol (Calc): 115 mg/dL (calc) (ref <130)
TRIGLYCERIDES: 55 mg/dL (ref <150)

## 2018-05-13 LAB — CBC WITH DIFFERENTIAL/PLATELET
BASOS PCT: 0.4 %
Basophils Absolute: 22 cells/uL (ref 0–200)
EOS PCT: 2 %
Eosinophils Absolute: 108 cells/uL (ref 15–500)
HEMATOCRIT: 41 % (ref 35.0–45.0)
Hemoglobin: 13.3 g/dL (ref 11.7–15.5)
LYMPHS ABS: 1885 {cells}/uL (ref 850–3900)
MCH: 26.8 pg — ABNORMAL LOW (ref 27.0–33.0)
MCHC: 32.4 g/dL (ref 32.0–36.0)
MCV: 82.5 fL (ref 80.0–100.0)
MPV: 10.1 fL (ref 7.5–12.5)
Monocytes Relative: 6.3 %
NEUTROS PCT: 56.4 %
Neutro Abs: 3046 cells/uL (ref 1500–7800)
PLATELETS: 246 10*3/uL (ref 140–400)
RBC: 4.97 10*6/uL (ref 3.80–5.10)
RDW: 13.4 % (ref 11.0–15.0)
Total Lymphocyte: 34.9 %
WBC: 5.4 10*3/uL (ref 3.8–10.8)
WBCMIX: 340 {cells}/uL (ref 200–950)

## 2018-05-13 LAB — MICROALBUMIN / CREATININE URINE RATIO
Creatinine, Urine: 77 mg/dL (ref 20–275)
MICROALB UR: 0.2 mg/dL
Microalb Creat Ratio: 3 mcg/mg creat (ref <30)

## 2018-05-13 LAB — HEMOGLOBIN A1C
EAG (MMOL/L): 7.3 (calc)
Hgb A1c MFr Bld: 6.2 % of total Hgb — ABNORMAL HIGH (ref <5.7)
MEAN PLASMA GLUCOSE: 131 (calc)

## 2018-05-13 LAB — TSH: TSH: 1.78 m[IU]/L (ref 0.40–4.50)

## 2018-05-13 LAB — VITAMIN D 25 HYDROXY (VIT D DEFICIENCY, FRACTURES): Vit D, 25-Hydroxy: 47 ng/mL (ref 30–100)

## 2018-05-15 ENCOUNTER — Encounter: Payer: Self-pay | Admitting: Internal Medicine

## 2018-05-15 ENCOUNTER — Ambulatory Visit (INDEPENDENT_AMBULATORY_CARE_PROVIDER_SITE_OTHER): Payer: 59 | Admitting: Internal Medicine

## 2018-05-15 VITALS — BP 144/90 | HR 81 | Temp 98.6°F | Ht 64.0 in | Wt 176.0 lb

## 2018-05-15 DIAGNOSIS — Z0000 Encounter for general adult medical examination without abnormal findings: Secondary | ICD-10-CM | POA: Diagnosis not present

## 2018-05-15 DIAGNOSIS — J309 Allergic rhinitis, unspecified: Secondary | ICD-10-CM

## 2018-05-15 DIAGNOSIS — R7302 Impaired glucose tolerance (oral): Secondary | ICD-10-CM

## 2018-05-15 DIAGNOSIS — I10 Essential (primary) hypertension: Secondary | ICD-10-CM

## 2018-05-15 DIAGNOSIS — M4316 Spondylolisthesis, lumbar region: Secondary | ICD-10-CM

## 2018-05-15 DIAGNOSIS — F411 Generalized anxiety disorder: Secondary | ICD-10-CM

## 2018-05-15 DIAGNOSIS — M8580 Other specified disorders of bone density and structure, unspecified site: Secondary | ICD-10-CM

## 2018-05-15 LAB — POCT URINALYSIS DIPSTICK
APPEARANCE: NORMAL
BILIRUBIN UA: NEGATIVE
Glucose, UA: NEGATIVE
KETONES UA: NEGATIVE
Leukocytes, UA: NEGATIVE
NITRITE UA: NEGATIVE
ODOR: NORMAL
PH UA: 7 (ref 5.0–8.0)
PROTEIN UA: NEGATIVE
Spec Grav, UA: 1.015 (ref 1.010–1.025)
UROBILINOGEN UA: 0.2 U/dL

## 2018-05-15 NOTE — Patient Instructions (Addendum)
It was a pleasure to see you. RTC in 6 months. Watch diet.  Continue same medications.  Try to get more exercise.

## 2018-05-15 NOTE — Progress Notes (Signed)
Subjective:    Patient ID: Nicole Bowers, female    DOB: 1956-03-31, 62 y.o.   MRN: [id number]  HPI 62 year old Female for health maintenance exam and evaluation of medical issues.  Has chronic back pain with hx disc herniations x 3 in neck and one in L5. Seeing physicial therapist and is having nerve ablation at [facility] by Dr. Retia PasseSaullo.Marland Kitchen. Dx with L4-L5 spondylolisthesis  Hx impaired glucose tolerance at 6.2%. Has been on vacation. Has been eating ice cream.Vitamin D level  Is normal.LDL is 101 and trigylcerides are normal.  Lipids are stable.  SHx: married. Nonsmoker. Social ETOH.  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  [place] leaving around the time of [place].  She came with several dogs.  She had posttraumatic stress related to that disaster.  [place] was born in [place] and raised in [place].  This is her second marriage.  First marriage ended in divorce.  She has 2 sons.  She has smoked for 30 years.  Social alcohol consumption.  Husband is status post CABG and doing better.  History of acne treated with doxycycline and Cleocin topical solution.  History of musculoskeletal pain related to physical labor for which she takes ibuprofen  Had stress echocardi[place] in [place] in 2006.  Exercise capacity was average.  Left ventricular systolic function appeared to be normal.  Family history: Mother died at age 60 of cancer with history of hypertension.  Father living with history of cancer, diabetes, hypertension and ulcers.  One brother and 3 sisters.  Dr. Seymour BarsLavoie is GYN physician  Had tetanus immunization May 2011  Past medical history: History of migraine headaches.  Is intolerant of codeine hydrocodone Demerol and oxycodone.  Has had adverse reactions to these in the past.  For cryoablation for menorrhagia 2007.  Was having issues with recurrent Candida vaginitis and Dr. Seymour BarsLavoie has recommended a specific diet that is low in  carbohydrates.    Review of Systems see above     Objective:   Physical Exam  Constitutional: She is oriented to person, place, and time. She appears well-developed and well-nourished. No distress.  HENT:  Head: Normocephalic and atraumatic.  Right Ear: External ear normal.  Left Ear: External ear normal.  Nose: Nose normal.  Mouth/Throat: Oropharynx is clear and moist.  Eyes: Pupils are equal, round, and reactive to light. Conjunctivae and EOM are normal. Right eye exhibits no discharge. Left eye exhibits no discharge. No scleral icterus.  Neck: Neck supple. No JVD present. No thyromegaly present.  Cardiovascular: Normal rate, regular rhythm and normal heart sounds.  No murmur heard. Pulmonary/Chest: Effort normal. No stridor. No respiratory distress. She has no wheezes.  Abdominal: Soft. She exhibits no distension and no mass. There is no tenderness. There is no rebound and no guarding.  Genitourinary:  Genitourinary Comments: Deferred to GYN  Musculoskeletal: She exhibits no edema.  Lymphadenopathy:    She has no cervical adenopathy.  Neurological: She is alert and oriented to person, place, and time. She displays normal reflexes. No cranial nerve deficit. She exhibits normal muscle tone. Coordination normal.  Skin: Skin is warm and dry. She is not diaphoretic.  Psychiatric: She has a normal mood and affect. Her behavior is normal. Judgment and thought content normal.  Vitals reviewed.         Assessment & Plan:  Chronic neck and back pain due to disc disease.  Is seeing Dr. Retia PasseSaullo today for L5 ablation  Essential hypertension  Impaired glucose tolerance-hemoglobin A1c is 6.2% and previously was 5.6%.  Trial of diet and exercise.  Fasting glucose is 117  Anxiety and insomnia-Xanax refilled.  Is on Zoloft.  Osteopenia- Boniva prescribed by GYN.  Bone density done November 2018.  Lowest T score -1.8  Allergic rhinitis-uses Flonase  Plan: Will be referred to  dietitian.  We will try to get more exercise and watch her diet.  Follow-up in 6 months.  Consider metformin if A1c does not improve at that time.

## 2018-05-17 ENCOUNTER — Other Ambulatory Visit: Payer: Self-pay | Admitting: Internal Medicine

## 2018-05-17 DIAGNOSIS — Z1231 Encounter for screening mammogram for malignant neoplasm of breast: Secondary | ICD-10-CM

## 2018-05-24 NOTE — Patient Instructions (Signed)
Erroneous encounter

## 2018-05-25 ENCOUNTER — Other Ambulatory Visit: Payer: Self-pay | Admitting: Internal Medicine

## 2018-06-12 ENCOUNTER — Other Ambulatory Visit: Payer: Self-pay | Admitting: Family Medicine

## 2018-07-19 ENCOUNTER — Other Ambulatory Visit: Payer: Self-pay | Admitting: Internal Medicine

## 2018-07-31 ENCOUNTER — Encounter: Payer: Self-pay | Admitting: Internal Medicine

## 2018-07-31 DIAGNOSIS — K648 Other hemorrhoids: Secondary | ICD-10-CM | POA: Insufficient documentation

## 2018-08-21 ENCOUNTER — Ambulatory Visit
Admission: RE | Admit: 2018-08-21 | Discharge: 2018-08-21 | Disposition: A | Discharge status: Home / Self Care | Payer: PRIVATE HEALTH INSURANCE | Source: Ambulatory Visit | Attending: Internal Medicine | Admitting: Internal Medicine

## 2018-08-22 ENCOUNTER — Ambulatory Visit (INDEPENDENT_AMBULATORY_CARE_PROVIDER_SITE_OTHER): Payer: PRIVATE HEALTH INSURANCE | Admitting: Family Medicine

## 2018-08-22 ENCOUNTER — Other Ambulatory Visit: Payer: Self-pay | Admitting: Internal Medicine

## 2018-08-22 ENCOUNTER — Ambulatory Visit: Payer: Self-pay

## 2018-08-22 ENCOUNTER — Encounter: Payer: Self-pay | Admitting: Family Medicine

## 2018-08-22 VITALS — BP 130/98 | HR 81 | Ht 64.0 in | Wt 176.0 lb

## 2018-08-22 DIAGNOSIS — M76892 Other specified enthesopathies of left lower limb, excluding foot: Secondary | ICD-10-CM | POA: Diagnosis not present

## 2018-08-22 DIAGNOSIS — M25562 Pain in left knee: Principal | ICD-10-CM

## 2018-08-22 DIAGNOSIS — G8929 Other chronic pain: Secondary | ICD-10-CM | POA: Diagnosis not present

## 2018-08-22 NOTE — Progress Notes (Signed)
[name] Smith D.O. [facility] [address] [place], [place] [address] Phone: [phone] Subjective:   Bruce Donath, Valerie Wolf, am serving as a scribe for Dr. Antoine PrimasZachary Smith.   CC: Left knee pain  NGE:[id number]:Subjective  Nimrat Cathie HoopsJane Beckley is a 62 y.o. female coming in with complaint of left knee pain. States that she was putting her suitcase in overhand bin and her knee gave out. She has been working out a lot. Has felt a strain in her knee. Has had nerve ablation in her back since last visit. Patinet has been on her feet quite a bit that day standing on concrete. Pain is occurring on lateral and posterior aspect of knee. Pain over hamstring tendon. Leg has been weak and it almost gives out. Has given out twice in past 2 days. Is wearing her patellofemoral knee sleeve brace which has helped.       Past Medical History:  Diagnosis Date  . Anxiety   . Hypertension   . Migraine headache    Past Surgical History:  Procedure Laterality Date  . CESAREAN SECTION  1987  . CRYOABLATION    . ENDOMETRIAL ABLATION  2008   Social History   Socioeconomic History  . Marital status: Married    Spouse name: Not on file  . Number of children: Not on file  . Years of education: Not on file  . Highest education level: Not on file  Occupational History  . Not on file  Social Needs  . Financial resource strain: Not on file  . Food insecurity:    Worry: Not on file    Inability: Not on file  . Transportation needs:    Medical: Not on file    Non-medical: Not on file  Tobacco Use  . Smoking status: Former Smoker    Types: Cigarettes    Last attempt to quit: 08/09/2015    Years since quitting: 3.0  . Smokeless tobacco: Never Used  Substance and Sexual Activity  . Alcohol use: Yes    Alcohol/week: 0.0 standard drinks    Comment: occ  . Drug use: No  . Sexual activity: Not Currently    Partners: Male    Comment: 1st intercourse- 7418, partners- 6920, married- 7 yrs   Lifestyle  . Physical  activity:    Days per week: Not on file    Minutes per session: Not on file  . Stress: Not on file  Relationships  . Social connections:    Talks on phone: Not on file    Gets together: Not on file    Attends religious service: Not on file    Active member of club or organization: Not on file    Attends meetings of clubs or organizations: Not on file    Relationship status: Not on file  Other Topics Concern  . Not on file  Social History Narrative  . Not on file   Allergies  Allergen Reactions  . Demerol Other (See Comments)    Causes blackouts  . Codeine     REACTION: nausea, vomiting  . Effexor [Venlafaxine Hydrochloride]   . Sulfonamide Derivatives     REACTION: hives, itching  . Celexa [Citalopram Hydrobromide] Anxiety   Family History  Problem Relation Age of Onset  . Cancer Mother        squamos cell  . Hypertension Mother   . Cancer Father        prostate  . Heart failure Father   . Hypertension Father   .  Cancer Maternal Aunt        squamos cell  . Heart failure Maternal Grandfather   . Heart attack Maternal Grandfather     Current Outpatient Medications (Endocrine & Metabolic):  .  ibandronate (BONIVA) 150 MG tablet, TAKE 1 BY MOUTH EVERY 30 DAYS  Current Outpatient Medications (Cardiovascular):  .  ramipril (ALTACE) 10 MG capsule, TAKE 1 CAPSULE EVERY DAY  Current Outpatient Medications (Respiratory):  .  fluticasone (FLONASE) 50 MCG/ACT nasal spray, Place 1 spray into both nostrils every morning.    Current Outpatient Medications (Other):  Marland Kitchen  ALPRAZolam (XANAX) 0.5 MG tablet, TAKE 1/2 TABLET BY MOUTH EVERY MORNING AND TAKE 1 TABLET BY MOUTH AT BEDTIME .  cholecalciferol (VITAMIN D) 1000 units tablet, Take 2,000 Units by mouth daily.  .  diazepam (VALIUM) 5 MG tablet, Take 1 tablet by mouth once. .  magnesium oxide (MAG-OX) 400 MG tablet, Take 400 mg by mouth daily. .  Multiple Vitamins-Minerals (MULTIVITAMIN WITH MINERALS) tablet, Take 1 tablet by  mouth daily. .  sertraline (ZOLOFT) 50 MG tablet, TAKE 1 TABLET BY MOUTH EVERY DAY (Patient taking differently: Take 50 mg by mouth at bedtime. ) .  Vitamin D, Ergocalciferol, (DRISDOL) 50000 units CAPS capsule, TAKE 1 CAPSULE (50,000 UNITS TOTAL) BY MOUTH EVERY 7 (SEVEN) DAYS.    Past medical history, social, surgical and family history all reviewed in electronic medical record.  No pertanent information unless stated regarding to the chief complaint.   Review of Systems:  No headache, visual changes, nausea, vomiting, diarrhea, constipation, dizziness, abdominal pain, skin rash, fevers, chills, night sweats, weight loss, swollen lymph nodes, body aches, joint swelling,  chest pain, shortness of breath, mood changes.  Positive muscle aches  Objective  Blood pressure (!) 130/98, pulse 81, height 5\' 4"  (1.626 m), weight 176 lb (79.8 kg), last menstrual period 07/23/2014, SpO2 98 %.   General: No apparent distress alert and oriented x3 mood and affect normal, dressed appropriately.  HEENT: Pupils equal, extraocular movements intact  Respiratory: Patient's speak in full sentences and does not appear short of breath  Cardiovascular: No lower extremity edema, non tender, no erythema  Skin: Warm dry intact with no signs of infection or rash on extremities or on axial skeleton.  Abdomen: Soft nontender  Neuro: Cranial nerves II through XII are intact, neurovascularly intact in all extremities with 2+ DTRs and 2+ pulses.  Lymph: No lymphadenopathy of posterior or anterior cervical chain or axillae bilaterally.  Gait antalgic MSK:  Non tender with full range of motion and good stability and symmetric strength and tone of shoulders, elbows, wrist, hip, and ankles bilaterally.  Left knee shows the patient is in full range of motion.  Mild pain over the lateral joint line.  Patient does have mild crepitus of the patella but no significant subluxation noted.  No tenderness over the medial joint  line.  MSK US performed of: Left knee This study was ordered, performed, and interpreted by Terrilee Files D.O.  Knee: Left knee shows hypoechoic changes around the popliteal tendon noted.  Some very mild joint effusion but very minimal overall.  Meniscus appear to be intact.  Patella and quadriceps tendon intact.   IMPRESSION: Consistent with recent popliteal subluxation   Impression and Recommendations:     This case required medical decision making of moderate complexity. The above documentation has been reviewed and is accurate and complete Judi Saa, DO       Note: This dictation was prepared with  Dragon dictation along with smaller Company secretary. Any transcriptional errors that result from this process are unintentional.

## 2018-08-22 NOTE — Assessment & Plan Note (Signed)
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  This is likely causing the instability.  With patient recently having radiofrequency ablation and continuing formal physical therapy I believe that there is some muscle imbalances that is likely contributing.  We discussed with patient will refer to formal physical therapy.  Discussed compression and icing regimen.  Discussed topical anti-inflammatories.  Follow-up again in 4 to 6 weeks

## 2018-08-22 NOTE — Patient Instructions (Signed)
Good to see you  Ice is your friend Popliteal subluxation  Continue the compression daily for 2 weeks then with exercises  pennsaid pinkie amount topically 2 times daily as needed.  No jumping or running and try to avoid full extension of the knee Exercises 3 times a week.  See me again in 4 weeks

## 2018-08-28 ENCOUNTER — Other Ambulatory Visit: Payer: Self-pay

## 2018-08-28 MED ORDER — ALPRAZOLAM 0.5 MG PO TABS: ORAL_TABLET | ORAL | 0 refills | Status: DC

## 2018-08-28 NOTE — Telephone Encounter (Signed)
Ok to refill there just one time. Please call it in and explain

## 2018-08-28 NOTE — Telephone Encounter (Signed)
[facility] refill [facility] and they are out she called [facility] [address] and they do have it, she would like a refill sent there, okay to refill?

## 2018-09-12 ENCOUNTER — Other Ambulatory Visit: Payer: Self-pay | Admitting: Family Medicine

## 2018-09-24 NOTE — Progress Notes (Signed)
[name] Deamber Buckhalter D.O.  [facility] [address] [place], [place] [address] Phone: [phone] Subjective:    CC: Knee pain follow-up  GLO:[id number]:Subjective  Nicole MilletMegan Cathie HoopsJane Bowers is a 62 y.o. female coming in with complaint of knee pain.  Patient was seen 4 weeks ago and diagnosed with probable popliteal subluxation.  Patient was to do compression, home exercise, icing regimen.  Patient states that she has been doing physical therapy which is helping. Pain is improving. Does still feel weak and has pain with lateral movements.  Patient states that overall the 1 feel that she is 75% better.  Making more more progress.  As long as she does the exercises she feels like she has been doing relatively well.      Past Medical History:  Diagnosis Date  . Anxiety   . Hypertension   . Migraine headache    Past Surgical History:  Procedure Laterality Date  . CESAREAN SECTION  1987  . CRYOABLATION    . ENDOMETRIAL ABLATION  2008   Social History   Socioeconomic History  . Marital status: Married    Spouse name: Not on file  . Number of children: Not on file  . Years of education: Not on file  . Highest education level: Not on file  Occupational History  . Not on file  Social Needs  . Financial resource strain: Not on file  . Food insecurity:    Worry: Not on file    Inability: Not on file  . Transportation needs:    Medical: Not on file    Non-medical: Not on file  Tobacco Use  . Smoking status: Former Smoker    Types: Cigarettes    Last attempt to quit: 08/09/2015    Years since quitting: 3.1  . Smokeless tobacco: Never Used  Substance and Sexual Activity  . Alcohol use: Yes    Alcohol/week: 0.0 standard drinks    Comment: occ  . Drug use: No  . Sexual activity: Not Currently    Partners: Male    Comment: 1st intercourse- [age], partners- 3820, married- 7 yrs   Lifestyle  . Physical activity:    Days per week: Not on file    Minutes per session: Not on file  . Stress: Not on  file  Relationships  . Social connections:    Talks on phone: Not on file    Gets together: Not on file    Attends religious service: Not on file    Active member of club or organization: Not on file    Attends meetings of clubs or organizations: Not on file    Relationship status: Not on file  Other Topics Concern  . Not on file  Social History Narrative  . Not on file   Allergies  Allergen Reactions  . Demerol Other (See Comments)    Causes blackouts  . Codeine     REACTION: nausea, vomiting  . Effexor [Venlafaxine Hydrochloride]   . Sulfonamide Derivatives     REACTION: hives, itching  . Celexa [Citalopram Hydrobromide] Anxiety   Family History  Problem Relation Age of Onset  . Cancer Mother        squamos cell  . Hypertension Mother   . Cancer Father        prostate  . Heart failure Father   . Hypertension Father   . Cancer Maternal Aunt        squamos cell  . Heart failure Maternal Grandfather   . Heart  attack Maternal Grandfather     Current Outpatient Medications (Endocrine & Metabolic):  .  ibandronate (BONIVA) 150 MG tablet, TAKE 1 BY MOUTH EVERY 30 DAYS  Current Outpatient Medications (Cardiovascular):  .  ramipril (ALTACE) 10 MG capsule, TAKE 1 CAPSULE EVERY DAY  Current Outpatient Medications (Respiratory):  .  fluticasone (FLONASE) 50 MCG/ACT nasal spray, Place 1 spray into both nostrils every morning.    Current Outpatient Medications (Other):  Marland Kitchen.  ALPRAZolam (XANAX) 0.5 MG tablet, TAKE 1/2 TABLET BY MOUTH EVERY MORNING AND TAKE 1 TABLET BY MOUTH AT BEDTIME .  cholecalciferol (VITAMIN D) 1000 units tablet, Take 2,000 Units by mouth daily.  .  diazepam (VALIUM) 5 MG tablet, Take 1 tablet by mouth once. .  magnesium oxide (MAG-OX) 400 MG tablet, Take 400 mg by mouth daily. .  Multiple Vitamins-Minerals (MULTIVITAMIN WITH MINERALS) tablet, Take 1 tablet by mouth daily. .  sertraline (ZOLOFT) 50 MG tablet, TAKE 1 TABLET BY MOUTH EVERY DAY (Patient  taking differently: Take 50 mg by mouth at bedtime. ) .  Vitamin D, Ergocalciferol, (DRISDOL) 1.25 MG (50000 UT) CAPS capsule, TAKE 1 CAPSULE (50,000 UNITS TOTAL) BY MOUTH EVERY 7 (SEVEN) DAYS.    Past medical history, social, surgical and family history all reviewed in electronic medical record.  No pertanent information unless stated regarding to the chief complaint.   Review of Systems:  No headache, visual changes, nausea, vomiting, diarrhea, constipation, dizziness, abdominal pain, skin rash, fevers, chills, night sweats, weight loss, swollen lymph nodes, body aches, joint swelling, muscle aches, chest pain, shortness of breath, mood changes.   Objective  Blood pressure (!) 128/100, pulse 78, height 5\' 4"  (1.626 m), weight 178 lb (80.7 kg), last menstrual period 07/23/2014, SpO2 98 %.   General: No apparent distress alert and oriented x3 mood and affect normal, dressed appropriately.  HEENT: Pupils equal, extraocular movements intact  Respiratory: Patient's speak in full sentences and does not appear short of breath  Cardiovascular: No lower extremity edema, non tender, no erythema  Skin: Warm dry intact with no signs of infection or rash on extremities or on axial skeleton.  Abdomen: Soft nontender  Neuro: Cranial nerves II through XII are intact, neurovascularly intact in all extremities with 2+ DTRs and 2+ pulses.  Lymph: No lymphadenopathy of posterior or anterior cervical chain or axillae bilaterally.  Gait normal with good balance and coordination.  MSK:  Non tender with full range of motion and good stability and symmetric strength and tone of shoulders, elbows, wrist, hip, and ankles bilaterally.  Left knee exam shows no significant abnormality on exam.  Patient minorly tender over the lateral point of the knee.  More around the popliteal area again.  Negative McMurray sign.  Full range of motion. Contralateral knee unremarkable    Impression and Recommendations:      The  above documentation has been reviewed and is accurate and complete [name], DO       Note: This dictation was prepared with Dragon dictation along with smaller phrase technology. Any transcriptional errors that result from this process are unintentional.

## 2018-09-25 ENCOUNTER — Ambulatory Visit (INDEPENDENT_AMBULATORY_CARE_PROVIDER_SITE_OTHER): Payer: PRIVATE HEALTH INSURANCE | Admitting: Family Medicine

## 2018-09-25 ENCOUNTER — Encounter: Payer: Self-pay | Admitting: Family Medicine

## 2018-09-25 DIAGNOSIS — M76892 Other specified enthesopathies of left lower limb, excluding foot: Secondary | ICD-10-CM

## 2018-09-25 NOTE — Assessment & Plan Note (Signed)
Improvement noted.  No change in management.  Continue conservative therapy at this point with patient decreasing the wear of the compression sleeve and continue the home exercises follow-up again in 6 weeks

## 2018-09-25 NOTE — Patient Instructions (Signed)
Good to see you  Ice is your friend once  A day  pennsaid pinkie amount topically at night for another week or 2  Exercises 3 times a week.  See me again in 6ish weeks if not better than 90% Happy New Year!

## 2018-09-26 ENCOUNTER — Other Ambulatory Visit: Payer: Self-pay | Admitting: Internal Medicine

## 2018-10-11 ENCOUNTER — Other Ambulatory Visit: Payer: Self-pay | Admitting: Internal Medicine

## 2018-10-31 ENCOUNTER — Other Ambulatory Visit: Payer: Self-pay | Admitting: Internal Medicine

## 2018-11-04 NOTE — Progress Notes (Signed)
[name] [address] [place], [place] [address] Phone: [phone] Subjective:   Bruce Donath, am serving as a scribe for Dr. Antoine Primas.  I'm seeing this patient by the request  of:    CC: left knee pain   [id number]   09/25/3029: Improvement noted.  No change in management.  Continue conservative therapy at this point with patient decreasing the wear of the compression sleeve and continue the home exercises follow-up again in 6 weeks  Update 11/06/2018:  Nicole Bowers is a 63 y.o. female coming in with complaint of left knee pain. Patient states that she has had improvement since last visit. Periodically feels instability. Does wear brace with activity. Patient does experience stiffness if seated for prolonged periods.  States there is many days where patient does not have any pain at all.    Past Medical History:  Diagnosis Date  . Anxiety   . Hypertension   . Migraine headache    Past Surgical History:  Procedure Laterality Date  . CESAREAN SECTION  1987  . CRYOABLATION    . ENDOMETRIAL ABLATION  2008   Social History   Socioeconomic History  . Marital status: Married    Spouse name: Not on file  . Number of children: Not on file  . Years of education: Not on file  . Highest education level: Not on file  Occupational History  . Not on file  Social Needs  . Financial resource strain: Not on file  . Food insecurity:    Worry: Not on file    Inability: Not on file  . Transportation needs:    Medical: Not on file    Non-medical: Not on file  Tobacco Use  . Smoking status: Former Smoker    Types: Cigarettes    Last attempt to quit: 08/09/2015    Years since quitting: 3.2  . Smokeless tobacco: Never Used  Substance and Sexual Activity  . Alcohol use: Yes    Alcohol/week: 0.0 standard drinks    Comment: occ  . Drug use: No  . Sexual activity: Not Currently    Partners: Male    Comment: 1st intercourse- 19,  partners- 1, married- 7 yrs   Lifestyle  . Physical activity:    Days per week: Not on file    Minutes per session: Not on file  . Stress: Not on file  Relationships  . Social connections:    Talks on phone: Not on file    Gets together: Not on file    Attends religious service: Not on file    Active member of club or organization: Not on file    Attends meetings of clubs or organizations: Not on file    Relationship status: Not on file  Other Topics Concern  . Not on file  Social History Narrative  . Not on file   Allergies  Allergen Reactions  . Demerol Other (See Comments)    Causes blackouts  . Codeine     REACTION: nausea, vomiting  . Effexor [Venlafaxine Hydrochloride]   . Sulfonamide Derivatives     REACTION: hives, itching  . Celexa [Citalopram Hydrobromide] Anxiety   Family History  Problem Relation Age of Onset  . Cancer Mother        squamos cell  . Hypertension Mother   . Cancer Father        prostate  . Heart failure Father   . Hypertension Father   . Cancer  Maternal Aunt        squamos cell  . Heart failure Maternal Grandfather   . Heart attack Maternal Grandfather     Current Outpatient Medications (Endocrine & Metabolic):  .  ibandronate (BONIVA) 150 MG tablet, TAKE 1 BY MOUTH EVERY 30 DAYS  Current Outpatient Medications (Cardiovascular):  .  ramipril (ALTACE) 10 MG capsule, TAKE 1 CAPSULE EVERY DAY  Current Outpatient Medications (Respiratory):  .  fluticasone (FLONASE) 50 MCG/ACT nasal spray, Place 1 spray into both nostrils every morning.    Current Outpatient Medications (Other):  Marland Kitchen.  ALPRAZolam (XANAX) 0.5 MG tablet, TAKE ONE-HALF TABLET BY MOUTH EVERY MORNING AND TAKE 1 TABLET BY MOUTH EVERY NIGHT AT BEDTIME .  cholecalciferol (VITAMIN D) 1000 units tablet, Take 2,000 Units by mouth daily.  .  diazepam (VALIUM) 5 MG tablet, Take 1 tablet by mouth once. .  magnesium oxide (MAG-OX) 400 MG tablet, Take 400 mg by mouth daily. .  Multiple  Vitamins-Minerals (MULTIVITAMIN WITH MINERALS) tablet, Take 1 tablet by mouth daily. .  sertraline (ZOLOFT) 50 MG tablet, TAKE 1 TABLET BY MOUTH EVERY DAY .  Vitamin D, Ergocalciferol, (DRISDOL) 1.25 MG (50000 UT) CAPS capsule, TAKE 1 CAPSULE (50,000 UNITS TOTAL) BY MOUTH EVERY 7 (SEVEN) DAYS.    Past medical history, social, surgical and family history all reviewed in electronic medical record.  No pertanent information unless stated regarding to the chief complaint.   Review of Systems:  No headache, visual changes, nausea, vomiting, diarrhea, constipation, dizziness, abdominal pain, skin rash, fevers, chills, night sweats, weight loss, swollen lymph nodes, body aches, joint swelling, muscle aches, chest pain, shortness of breath, mood changes.   Objective  Blood pressure 128/90, pulse 85, height 5\' 4"  (1.626 m), weight 178 lb (80.7 kg), last menstrual period 07/23/2014, SpO2 99 %.   General: No apparent distress alert and oriented x3 mood and affect normal, dressed appropriately.  HEENT: Pupils equal, extraocular movements intact  Respiratory: Patient's speak in full sentences and does not appear short of breath  Cardiovascular: No lower extremity edema, non tender, no erythema  Skin: Warm dry intact with no signs of infection or rash on extremities or on axial skeleton.  Abdomen: Soft nontender  Neuro: Cranial nerves II through XII are intact, neurovascularly intact in all extremities with 2+ DTRs and 2+ pulses.  Lymph: No lymphadenopathy of posterior or anterior cervical chain or axillae bilaterally.  Gait normal with good balance and coordination.  MSK:  Non tender with full range of motion and good stability and symmetric strength and tone of shoulders, elbows, wrist, hip, and ankles bilaterally.  Knee: Left Normal to inspection with no erythema or effusion or obvious bony abnormalities. Palpation normal with no warmth, joint line tenderness, patellar tenderness, or condyle  tenderness. ROM full in flexion and extension and lower leg rotation. Ligaments with solid consistent endpoints including ACL, PCL, LCL, MCL. Negative Mcmurray's, Apley's, and Thessalonian tests. Non painful patellar compression. Patellar glide without crepitus. Patellar and quadriceps tendons unremarkable. Hamstring and quadriceps strength is normal. Contralateral knee unremarkable as well    Impression and Recommendations:      The above documentation has been reviewed and is accurate and complete Judi SaaZachary M Llewellyn Schoenberger, DO       Note: This dictation was prepared with Dragon dictation along with smaller phrase technology. Any transcriptional errors that result from this process are unintentional.

## 2018-11-06 ENCOUNTER — Encounter: Payer: Self-pay | Admitting: Family Medicine

## 2018-11-06 ENCOUNTER — Ambulatory Visit: Payer: PRIVATE HEALTH INSURANCE | Admitting: Family Medicine

## 2018-11-06 DIAGNOSIS — M76892 Other specified enthesopathies of left lower limb, excluding foot: Secondary | ICD-10-CM

## 2018-11-06 NOTE — Assessment & Plan Note (Signed)
Popliteal tendinitis Completely resolved at this time.  Discussed compression for another month.  Discussed icing regimen.  Follow-up as needed

## 2018-11-06 NOTE — Patient Instructions (Addendum)
Good to see you  I am so glad you are doing better ;Continue the brace with exercising for another month  Enjoy the doggies See me again when you need me

## 2018-11-10 ENCOUNTER — Other Ambulatory Visit: Payer: 59 | Admitting: Internal Medicine

## 2018-11-10 DIAGNOSIS — R7302 Impaired glucose tolerance (oral): Secondary | ICD-10-CM

## 2018-11-11 LAB — HEMOGLOBIN A1C
HEMOGLOBIN A1C: 5.9 %{Hb} — AB (ref <5.7)
MEAN PLASMA GLUCOSE: 123 (calc)
eAG (mmol/L): 6.8 (calc)

## 2018-11-11 LAB — MICROALBUMIN / CREATININE URINE RATIO
Creatinine, Urine: 21 mg/dL (ref 20–275)
Microalb, Ur: <0.2 mg/dL

## 2018-11-13 ENCOUNTER — Encounter: Payer: Self-pay | Admitting: Internal Medicine

## 2018-11-13 ENCOUNTER — Ambulatory Visit: Payer: 59 | Admitting: Internal Medicine

## 2018-11-13 VITALS — BP 160/100 | HR 83 | Ht 64.0 in | Wt 168.0 lb

## 2018-11-13 DIAGNOSIS — R7302 Impaired glucose tolerance (oral): Secondary | ICD-10-CM

## 2018-11-13 DIAGNOSIS — I10 Essential (primary) hypertension: Secondary | ICD-10-CM

## 2018-11-13 MED ORDER — AMLODIPINE BESYLATE 5 MG PO TABS: 5.0000 mg | ORAL_TABLET | Freq: Every day | ORAL | 0 refills | Status: DC

## 2018-11-13 NOTE — Patient Instructions (Signed)
Add Norvasc 5 mg daily to Altace and follow up in 3 weeks. Keep record of BP readings

## 2018-11-13 NOTE — Progress Notes (Signed)
   Subjective:    Patient ID: Nicole Bowers, female    DOB: [birth year], 63 y.o.   MRN: [id number]  HPI   Has had popliteal tendinitis. Seen by Dr. Terrilee Files in November and December.  Symptoms are improving with conservative treatment.  See note from Dr. Katrinka Blazing.  Had 3 hemorrhoids banded and colonoscopy by Dr. Kinnie Scales.  Hemoglobin is 5.9%.  History of impaired glucose tolerance. Treated with diet and exercise. Hgb AIC in August 2019 was 6.2%.  Review of Systems see above- working with poodles.     Objective:   Physical Exam Vitals signs reviewed.  Constitutional:      Appearance: Normal appearance.  HENT:     Head: Normocephalic and atraumatic.  Neck:     Musculoskeletal: Neck supple.  Cardiovascular:     Rate and Rhythm: Normal rate and regular rhythm.  Pulmonary:     Effort: Pulmonary effort is normal.     Breath sounds: Normal breath sounds. No wheezing.  Neurological:     Mental Status: She is alert.  Psychiatric:        Mood and Affect: Mood normal.        Behavior: Behavior normal.        Judgment: Judgment normal.           Assessment & Plan:  Essential hypertension-recheck blood pressure and it was elevated in each arm.  Add Norvasc 5 mg daily and follow-up in approximately 3 weeks.  Continue Altace.  Patient is to check blood pressure twice daily morning and evening.  Impaired glucose tolerance-improved from August 2019 and stable.  Continue diet and exercise.  Popliteal tendinitis- seen by Dr. Ayesha Mohair at [facility]

## 2018-11-23 ENCOUNTER — Other Ambulatory Visit: Payer: Self-pay

## 2018-11-27 MED ORDER — ALPRAZOLAM 0.5 MG PO TABS: ORAL_TABLET | ORAL | 5 refills | Status: DC

## 2018-11-27 NOTE — Telephone Encounter (Signed)
I have filled this previously please check

## 2018-11-27 NOTE — Telephone Encounter (Signed)
Call in x 6 months

## 2018-11-27 NOTE — Telephone Encounter (Signed)
Patient called to request a refill on her XANAX.

## 2018-11-27 NOTE — Telephone Encounter (Signed)
This was last filled on 09/26/2018 with one refill.

## 2018-12-04 ENCOUNTER — Ambulatory Visit: Payer: 59 | Admitting: Internal Medicine

## 2018-12-04 ENCOUNTER — Encounter: Payer: Self-pay | Admitting: Internal Medicine

## 2018-12-04 ENCOUNTER — Other Ambulatory Visit: Payer: Self-pay

## 2018-12-04 VITALS — BP 128/80 | HR 87 | Temp 98.5°F | Ht 64.0 in | Wt 168.0 lb

## 2018-12-04 DIAGNOSIS — J22 Unspecified acute lower respiratory infection: Secondary | ICD-10-CM | POA: Diagnosis not present

## 2018-12-04 DIAGNOSIS — I10 Essential (primary) hypertension: Secondary | ICD-10-CM

## 2018-12-04 MED ORDER — DOXYCYCLINE HYCLATE 100 MG PO TABS: 100.0000 mg | ORAL_TABLET | Freq: Two times a day (BID) | ORAL | 0 refills | Status: DC

## 2018-12-04 MED ORDER — AMLODIPINE BESYLATE 5 MG PO TABS: 5.0000 mg | ORAL_TABLET | Freq: Every day | ORAL | 3 refills | Status: DC

## 2018-12-04 MED ORDER — FLUCONAZOLE 150 MG PO TABS: 150.0000 mg | ORAL_TABLET | Freq: Once | ORAL | 1 refills | Status: DC

## 2018-12-04 NOTE — Progress Notes (Signed)
   Subjective:    Patient ID: Nicole Bowers, female    DOB: 10/07/1955, 63 y.o.   MRN: [id number]  HPI 63 year old Female with cough x 4 weeks.here for recheck on hypertension. At last visit added Norvasc 5 mg daily.  Blood pressure is excellent today at 128/80.  She feels well on current regimen with no complaints.  Has had a cough for a month.  No significant travel history.  Pulse oximetry is 98%.  She is afebrile.    Review of Systems No sore throat fever or chills     Objective:   Physical Exam   BP128/80 . VSS Afebrile.  Left TM is dull and retracted.  Right TM is slightly full.  Pharynx is very slightly injected without exudate.  Neck supple.  Chest clear to auscultation without rales or wheezing.  Is coughing a lot in the office today.      Assessment & Plan:  Acute lower respiratory infection  Essential hypertension-improved with the addition of Norvasc  Plan: Doxycycline 100 mg twice daily for 10 days.  Diflucan 150 mg tablet with 1 refill if needed for Candida vaginitis due to antibiotic therapy.  Follow-up in August 2020 for physical examination and continue to monitor blood pressure at home.

## 2018-12-04 NOTE — Patient Instructions (Signed)
Continue Norvasc 5 mg daily.  Doxycycline 100 mg twice daily for 10 days.  Diflucan if needed for Candida vaginitis.  Follow-up August 2020 for physical exam

## 2018-12-18 ENCOUNTER — Other Ambulatory Visit: Payer: Self-pay | Admitting: Internal Medicine

## 2018-12-18 NOTE — Telephone Encounter (Signed)
Patient states she has requested refill multiple times, do not see documented of approval nor denial. No recent Vit D check. Please advise

## 2018-12-21 MED ORDER — VITAMIN D (ERGOCALCIFEROL) 1.25 MG (50000 UNIT) PO CAPS: 50000.0000 [IU] | ORAL_CAPSULE | ORAL | 0 refills | Status: DC

## 2018-12-21 NOTE — Telephone Encounter (Signed)
Refill done.  

## 2019-01-09 ENCOUNTER — Other Ambulatory Visit: Payer: Self-pay | Admitting: Internal Medicine

## 2019-01-10 DIAGNOSIS — N760 Acute vaginitis: Secondary | ICD-10-CM

## 2019-01-10 NOTE — Telephone Encounter (Signed)
Does pt need this refill?

## 2019-01-10 NOTE — Telephone Encounter (Signed)
She said she would like a refill bc she is still itching but no discharge.

## 2019-03-01 ENCOUNTER — Other Ambulatory Visit: Payer: Self-pay | Admitting: Family Medicine

## 2019-03-01 ENCOUNTER — Other Ambulatory Visit: Payer: Self-pay | Admitting: Internal Medicine

## 2019-03-01 NOTE — Telephone Encounter (Signed)
Will speak with Dr. Katrinka Blazing on Friday.

## 2019-03-02 ENCOUNTER — Encounter: Payer: Self-pay | Admitting: Family Medicine

## 2019-03-02 ENCOUNTER — Ambulatory Visit (INDEPENDENT_AMBULATORY_CARE_PROVIDER_SITE_OTHER): Payer: PRIVATE HEALTH INSURANCE | Admitting: Family Medicine

## 2019-03-02 DIAGNOSIS — E559 Vitamin D deficiency, unspecified: Secondary | ICD-10-CM

## 2019-03-02 MED ORDER — VITAMIN D (ERGOCALCIFEROL) 1.25 MG (50000 UNIT) PO CAPS: 50000.0000 [IU] | ORAL_CAPSULE | ORAL | 1 refills | Status: DC

## 2019-03-02 NOTE — Assessment & Plan Note (Signed)
Vitamin D deficiency.  Discussed which activities of doing which wants to avoid.  Refilled vitamin D.  Follow-up 6 months

## 2019-03-02 NOTE — Progress Notes (Signed)
Virtual Visit via Video Note  I connected with Nicole Bowers on 03/02/19 at  1:00 PM EDT by a video enabled telemedicine application and verified that I am speaking with the correct person using two identifiers.  Location: Patient: Patient is in home setting actually outside Provider: Was in office setting   I discussed the limitations of evaluation and management by telemedicine and the availability of in person appointments. The patient expressed understanding and agreed to proceed.  History of Present Illness: Patient is a very pleasant 63 year old female patient has had bilateral knee pain previously.  Has been doing very well.  Feels like the stress reaction in her knee has done well with the once weekly vitamin D.  Feels like she has more energy as well.  Has had a history of the back pain as well.  Doing very well.    Observations/Objective: Very pleasant, alert and oriented x3.  Walking outside   Assessment and Plan: Knee pain, back pain Refill vitamin D for vitamin D deficiency  Follow Up Instructions: Follow-up as needed likely 6 months    I discussed the assessment and treatment plan with the patient. The patient was provided an opportunity to ask questions and all were answered. The patient agreed with the plan and demonstrated an understanding of the instructions.   The patient was advised to call back or seek an in-person evaluation if the symptoms worsen or if the condition fails to improve as anticipated.  I provided 15.   that was minutes of face-to-face time during this encounter.   Judi Saa, DO

## 2019-03-31 ENCOUNTER — Other Ambulatory Visit: Payer: Self-pay | Admitting: Internal Medicine

## 2019-05-11 ENCOUNTER — Other Ambulatory Visit: Payer: 59 | Admitting: Internal Medicine

## 2019-05-11 ENCOUNTER — Other Ambulatory Visit: Payer: Self-pay

## 2019-05-11 DIAGNOSIS — R7302 Impaired glucose tolerance (oral): Secondary | ICD-10-CM

## 2019-05-12 LAB — MICROALBUMIN / CREATININE URINE RATIO
Creatinine, Urine: 46 mg/dL (ref 20–275)
Microalb Creat Ratio: 9 mcg/mg creat (ref <30)
Microalb, Ur: 0.4 mg/dL

## 2019-05-12 LAB — HEMOGLOBIN A1C
Hgb A1c MFr Bld: 5.8 % of total Hgb — ABNORMAL HIGH (ref <5.7)
Mean Plasma Glucose: 120 (calc)
eAG (mmol/L): 6.6 (calc)

## 2019-05-14 ENCOUNTER — Other Ambulatory Visit (HOSPITAL_COMMUNITY)
Admission: RE | Admit: 2019-05-14 | Discharge: 2019-05-14 | Disposition: A | Discharge status: Home / Self Care | Payer: PRIVATE HEALTH INSURANCE | Source: Ambulatory Visit | Attending: Internal Medicine | Admitting: Internal Medicine

## 2019-05-14 ENCOUNTER — Other Ambulatory Visit: Payer: Self-pay

## 2019-05-14 ENCOUNTER — Encounter: Payer: Self-pay | Admitting: Internal Medicine

## 2019-05-14 ENCOUNTER — Ambulatory Visit (INDEPENDENT_AMBULATORY_CARE_PROVIDER_SITE_OTHER): Payer: 59 | Admitting: Internal Medicine

## 2019-05-14 VITALS — BP 120/90 | HR 84 | Temp 98.0°F | Ht 64.0 in | Wt 161.0 lb

## 2019-05-14 DIAGNOSIS — I10 Essential (primary) hypertension: Secondary | ICD-10-CM

## 2019-05-14 DIAGNOSIS — E7800 Pure hypercholesterolemia, unspecified: Secondary | ICD-10-CM

## 2019-05-14 DIAGNOSIS — R7302 Impaired glucose tolerance (oral): Secondary | ICD-10-CM

## 2019-05-14 DIAGNOSIS — Z124 Encounter for screening for malignant neoplasm of cervix: Secondary | ICD-10-CM

## 2019-05-14 DIAGNOSIS — Z0000 Encounter for general adult medical examination without abnormal findings: Secondary | ICD-10-CM

## 2019-05-14 DIAGNOSIS — F419 Anxiety disorder, unspecified: Secondary | ICD-10-CM | POA: Diagnosis not present

## 2019-05-14 DIAGNOSIS — F321 Major depressive disorder, single episode, moderate: Secondary | ICD-10-CM

## 2019-05-14 DIAGNOSIS — M8580 Other specified disorders of bone density and structure, unspecified site: Secondary | ICD-10-CM | POA: Diagnosis not present

## 2019-05-14 LAB — POCT URINALYSIS DIPSTICK
Appearance: NEGATIVE
Bilirubin, UA: NEGATIVE
Blood, UA: NEGATIVE
Glucose, UA: NEGATIVE
Ketones, UA: NEGATIVE
Leukocytes, UA: NEGATIVE
Nitrite, UA: NEGATIVE
Odor: NEGATIVE
Protein, UA: NEGATIVE
Spec Grav, UA: 1.01 (ref 1.010–1.025)
Urobilinogen, UA: 0.2 E.U./dL
pH, UA: 7.5 (ref 5.0–8.0)

## 2019-05-14 MED ORDER — SERTRALINE HCL 100 MG PO TABS: 100.0000 mg | ORAL_TABLET | Freq: Every day | ORAL | 3 refills | Status: DC

## 2019-05-15 LAB — COMPLETE METABOLIC PANEL WITH GFR
AG Ratio: 1.8 (calc) (ref 1.0–2.5)
ALT: 41 U/L — ABNORMAL HIGH (ref 6–29)
AST: 26 U/L (ref 10–35)
Albumin: 4.8 g/dL (ref 3.6–5.1)
Alkaline phosphatase (APISO): 138 U/L (ref 37–153)
BUN: 18 mg/dL (ref 7–25)
CO2: 29 mmol/L (ref 20–32)
Calcium: 10.3 mg/dL (ref 8.6–10.4)
Chloride: 105 mmol/L (ref 98–110)
Creat: 0.74 mg/dL (ref 0.50–0.99)
GFR, Est African American: 101 mL/min/{1.73_m2} (ref >=60)
GFR, Est Non African American: 87 mL/min/{1.73_m2} (ref >=60)
Globulin: 2.6 g/dL (calc) (ref 1.9–3.7)
Glucose, Bld: 100 mg/dL — ABNORMAL HIGH (ref 65–99)
Potassium: 4.3 mmol/L (ref 3.5–5.3)
Sodium: 143 mmol/L (ref 135–146)
Total Bilirubin: 0.4 mg/dL (ref 0.2–1.2)
Total Protein: 7.4 g/dL (ref 6.1–8.1)

## 2019-05-15 LAB — CBC WITH DIFFERENTIAL/PLATELET
Absolute Monocytes: 403 cells/uL (ref 200–950)
Basophils Absolute: 13 cells/uL (ref 0–200)
Basophils Relative: 0.2 %
Eosinophils Absolute: 78 cells/uL (ref 15–500)
Eosinophils Relative: 1.2 %
HCT: 45.8 % — ABNORMAL HIGH (ref 35.0–45.0)
Hemoglobin: 14.7 g/dL (ref 11.7–15.5)
Lymphs Abs: 2074 cells/uL (ref 850–3900)
MCH: 27.1 pg (ref 27.0–33.0)
MCHC: 32.1 g/dL (ref 32.0–36.0)
MCV: 84.3 fL (ref 80.0–100.0)
MPV: 9.9 fL (ref 7.5–12.5)
Monocytes Relative: 6.2 %
Neutro Abs: 3933 cells/uL (ref 1500–7800)
Neutrophils Relative %: 60.5 %
Platelets: 252 10*3/uL (ref 140–400)
RBC: 5.43 10*6/uL — ABNORMAL HIGH (ref 3.80–5.10)
RDW: 13.1 % (ref 11.0–15.0)
Total Lymphocyte: 31.9 %
WBC: 6.5 10*3/uL (ref 3.8–10.8)

## 2019-05-15 LAB — CYTOLOGY - PAP
Diagnosis: NEGATIVE
HPV: NOT DETECTED

## 2019-05-15 LAB — LIPID PANEL
Cholesterol: 225 mg/dL — ABNORMAL HIGH (ref <200)
HDL: 74 mg/dL (ref >=50)
LDL Cholesterol (Calc): 132 mg/dL (calc) — ABNORMAL HIGH
Non-HDL Cholesterol (Calc): 151 mg/dL (calc) — ABNORMAL HIGH (ref <130)
Total CHOL/HDL Ratio: 3 (calc) (ref <5.0)
Triglycerides: 88 mg/dL (ref <150)

## 2019-05-15 LAB — VITAMIN D 25 HYDROXY (VIT D DEFICIENCY, FRACTURES): Vit D, 25-Hydroxy: 59 ng/mL (ref 30–100)

## 2019-05-15 LAB — TSH: TSH: 1.88 mIU/L (ref 0.40–4.50)

## 2019-05-24 ENCOUNTER — Telehealth: Payer: Self-pay | Admitting: Internal Medicine

## 2019-05-24 MED ORDER — RAMIPRIL 10 MG PO CAPS: ORAL_CAPSULE | ORAL | 0 refills | Status: DC

## 2019-05-24 NOTE — Telephone Encounter (Signed)
Received Fax RX request from  [facility]  Medication - ramipril (ALTACE) 10 MG capsule    Last Refill - 03/01/19  Last OV - 05/14/19  Last CPE 05/14/19

## 2019-05-25 ENCOUNTER — Other Ambulatory Visit: Payer: Self-pay | Admitting: Internal Medicine

## 2019-05-25 MED ORDER — ALPRAZOLAM 0.5 MG PO TABS: ORAL_TABLET | ORAL | 5 refills | Status: DC

## 2019-05-25 NOTE — Telephone Encounter (Signed)
Received Fax RX request from  [facility]   Medication - ALPRAZolam (XANAX) 0.5 MG tablet   Last Refill - 04/25/19  Last OV - 05/14/19  Last CPE - 05/14/19
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
## 2019-05-28 NOTE — Patient Instructions (Signed)
Have annual flu vaccine.  Would like to follow-up in 6 months with regard to lipid panel.  Watch diet and exercise.

## 2019-08-03 ENCOUNTER — Other Ambulatory Visit: Payer: Self-pay | Admitting: Family Medicine

## 2019-08-09 ENCOUNTER — Other Ambulatory Visit: Payer: Self-pay | Admitting: Internal Medicine

## 2019-09-17 ENCOUNTER — Ambulatory Visit (INDEPENDENT_AMBULATORY_CARE_PROVIDER_SITE_OTHER): Payer: 59 | Admitting: Internal Medicine

## 2019-09-17 ENCOUNTER — Other Ambulatory Visit: Payer: Self-pay

## 2019-09-17 ENCOUNTER — Encounter: Payer: Self-pay | Admitting: Internal Medicine

## 2019-09-17 VITALS — BP 140/80 | Temp 97.8°F | Ht 64.0 in | Wt 159.0 lb

## 2019-09-17 DIAGNOSIS — N3000 Acute cystitis without hematuria: Secondary | ICD-10-CM

## 2019-09-17 DIAGNOSIS — R8290 Unspecified abnormal findings in urine: Secondary | ICD-10-CM

## 2019-09-17 DIAGNOSIS — R350 Frequency of micturition: Secondary | ICD-10-CM

## 2019-09-17 LAB — POCT URINALYSIS DIPSTICK
Appearance: NEGATIVE
Bilirubin, UA: NEGATIVE
Glucose, UA: NEGATIVE
Ketones, UA: NEGATIVE
Nitrite, UA: NEGATIVE
Odor: NEGATIVE
Protein, UA: NEGATIVE
Spec Grav, UA: 1.015 (ref 1.010–1.025)
Urobilinogen, UA: 0.2 E.U./dL
pH, UA: 6.5 (ref 5.0–8.0)

## 2019-09-17 MED ORDER — CIPROFLOXACIN HCL 500 MG PO TABS: 500.0000 mg | ORAL_TABLET | Freq: Two times a day (BID) | ORAL | 0 refills | Status: DC

## 2019-09-17 NOTE — Patient Instructions (Signed)
Cipro 250 mg twice daily x 7 days. Urine culture pending. 

## 2019-09-17 NOTE — Progress Notes (Signed)
   Subjective:    Patient ID: Nicole Bowers, female    DOB: [birth year], 63 y.o.   MRN: [id number]  HPI 63 year old Female has had acute onset of the past 24 hours of dysuria and frequency.  She is concerned she may have a UTI.  Not frequently sexually active.  No history of recurrent urinary tract infections.  Dipstick urine shows urine to be cloudy with moderate occult blood and trace LE.  Review of Systems no fever chills nausea or vomiting.     Objective:   Physical Exam  No CVA tenderness.  Urine dipstick reviewed.  Urine culture ordered.      Assessment & Plan:  Acute cystitis does not have visible hematuria but dipstick is showing occult blood  Plan: Cipro 500 mg twice daily for 7 days  Addendum: Urine culture resulted September 18, 2019 reveals no growth.  Still want patient to complete course of Cipro because she is asymptomatic and has abnormal urine dipstick.

## 2019-09-18 LAB — URINE CULTURE
MICRO NUMBER:: 1217519
Result:: NO GROWTH
SPECIMEN QUALITY:: ADEQUATE

## 2019-11-14 ENCOUNTER — Other Ambulatory Visit: Payer: Self-pay | Admitting: Internal Medicine

## 2019-11-26 ENCOUNTER — Other Ambulatory Visit: Payer: Self-pay | Admitting: Internal Medicine

## 2019-11-26 MED ORDER — ALPRAZOLAM 0.5 MG PO TABS: ORAL_TABLET | ORAL | 5 refills | Status: DC

## 2019-11-26 NOTE — Telephone Encounter (Signed)
Please let patient kmow I have been here for 14 days straight and have not seen this request, then refill x 6 months and I will sign

## 2019-11-26 NOTE — Telephone Encounter (Signed)
Nicole Bowers [phone]  [name] called to say that [facility] told her they had sent a request for a refill on below medication but they had not heard back from [place].    ALPRAZolam (XANAX) 0.5 MG tablet  [facility] [id number] - [place], [place] - [phone] [facility] [address] (Ph: [phone])

## 2019-12-07 ENCOUNTER — Other Ambulatory Visit: Payer: Self-pay

## 2019-12-07 ENCOUNTER — Other Ambulatory Visit: Payer: 59 | Admitting: Internal Medicine

## 2019-12-07 DIAGNOSIS — R7302 Impaired glucose tolerance (oral): Secondary | ICD-10-CM

## 2019-12-07 DIAGNOSIS — E7800 Pure hypercholesterolemia, unspecified: Secondary | ICD-10-CM

## 2019-12-07 NOTE — Addendum Note (Signed)
Addended by: Gregery Na on: 12/07/2019 09:14 AM   Modules accepted: Orders

## 2019-12-08 LAB — HEPATIC FUNCTION PANEL
AG Ratio: 1.9 (calc) (ref 1.0–2.5)
ALT: 26 U/L (ref 6–29)
AST: 22 U/L (ref 10–35)
Albumin: 4.5 g/dL (ref 3.6–5.1)
Alkaline phosphatase (APISO): 134 U/L (ref 37–153)
Bilirubin, Direct: 0.1 mg/dL (ref 0.0–0.2)
Globulin: 2.4 g/dL (calc) (ref 1.9–3.7)
Indirect Bilirubin: 0.2 mg/dL (calc) (ref 0.2–1.2)
Total Bilirubin: 0.3 mg/dL (ref 0.2–1.2)
Total Protein: 6.9 g/dL (ref 6.1–8.1)

## 2019-12-08 LAB — HEMOGLOBIN A1C
Hgb A1c MFr Bld: 5.7 % of total Hgb — ABNORMAL HIGH (ref <5.7)
Mean Plasma Glucose: 117 (calc)
eAG (mmol/L): 6.5 (calc)

## 2019-12-08 LAB — LIPID PANEL
Cholesterol: 179 mg/dL (ref <200)
HDL: 72 mg/dL (ref >=50)
LDL Cholesterol (Calc): 93 mg/dL (calc)
Non-HDL Cholesterol (Calc): 107 mg/dL (calc) (ref <130)
Total CHOL/HDL Ratio: 2.5 (calc) (ref <5.0)
Triglycerides: 49 mg/dL (ref <150)

## 2019-12-10 ENCOUNTER — Ambulatory Visit: Payer: 59 | Admitting: Internal Medicine

## 2019-12-10 ENCOUNTER — Other Ambulatory Visit: Payer: Self-pay

## 2019-12-10 ENCOUNTER — Encounter: Payer: Self-pay | Admitting: Internal Medicine

## 2019-12-10 VITALS — BP 130/90 | HR 81 | Temp 98.0°F | Ht 64.0 in | Wt 153.0 lb

## 2019-12-10 DIAGNOSIS — I10 Essential (primary) hypertension: Secondary | ICD-10-CM

## 2019-12-10 DIAGNOSIS — M8580 Other specified disorders of bone density and structure, unspecified site: Secondary | ICD-10-CM

## 2019-12-10 DIAGNOSIS — R7302 Impaired glucose tolerance (oral): Secondary | ICD-10-CM

## 2019-12-10 DIAGNOSIS — F411 Generalized anxiety disorder: Secondary | ICD-10-CM | POA: Diagnosis not present

## 2019-12-10 NOTE — Progress Notes (Signed)
   Subjective:    Patient ID: Nicole Bowers, female    DOB: [birth year], 64 y.o.   MRN: [id number]  HPI  64 year old Female for 6 month recheck. Son has been in rehab in [place]. Doing better. Mother died of alcoholism. Brother died by suicide due to heroin addiction.  Hx of anxiety and HTN.  Takes sotalol for anxiety and depression.  For hypertension is on Amaryl 10 mg daily and amlodipine 5 mg daily.  Aunt died recently.  Patient enrolled in Al- Anon on Zoom.  Hx of impaired glucose tolerance. Hgb AIC stable at 5.7%  Lipid panel now normal and LFTs are now normal.  Patient has been working on diet and exercise.  She is not on statin medication.  Review of Systems no new complaints     Objective:   Physical Exam  BP 130/90 pulse, 81, Weight 153 pounds, BMI 26, BP rechecked 120/84 ; Neck supple Chest clear, Cor: RRR, Ext: no edema     Assessment & Plan:  Anxiety - stable with Xanax and Zoloft  HTN- stable with current regimen of Altase and amlodipine  Situational stress with son.  Continue Zoloft.  Has Xanax for anxiety on a as needed basis.  Plan: RTC in 6 months. Continue same meds.  Xanax refilled.  Order for mammogram and bone density study.  Return in August for CPE and fasting labs.

## 2019-12-10 NOTE — Patient Instructions (Signed)
Continue current medications and follow-up in 6 months.  Have mammogram and bone density study.  Labs are within normal limits.

## 2020-01-14 ENCOUNTER — Other Ambulatory Visit: Payer: Self-pay | Admitting: Family Medicine

## 2020-02-11 ENCOUNTER — Other Ambulatory Visit: Payer: Self-pay | Admitting: Internal Medicine

## 2020-02-11 DIAGNOSIS — Z1231 Encounter for screening mammogram for malignant neoplasm of breast: Secondary | ICD-10-CM

## 2020-02-12 ENCOUNTER — Other Ambulatory Visit: Payer: Self-pay | Admitting: Internal Medicine

## 2020-02-20 ENCOUNTER — Other Ambulatory Visit: Payer: Self-pay

## 2020-02-20 MED ORDER — SERTRALINE HCL 100 MG PO TABS: 100.0000 mg | ORAL_TABLET | Freq: Every day | ORAL | 3 refills | Status: DC

## 2020-04-24 ENCOUNTER — Ambulatory Visit
Admission: RE | Admit: 2020-04-24 | Discharge: 2020-04-24 | Disposition: A | Discharge status: Home / Self Care | Payer: PRIVATE HEALTH INSURANCE | Source: Ambulatory Visit | Attending: Internal Medicine | Admitting: Internal Medicine

## 2020-04-24 ENCOUNTER — Other Ambulatory Visit: Payer: Self-pay

## 2020-04-24 DIAGNOSIS — Z1231 Encounter for screening mammogram for malignant neoplasm of breast: Secondary | ICD-10-CM

## 2020-05-05 ENCOUNTER — Encounter: Payer: Self-pay | Admitting: Internal Medicine

## 2020-05-05 ENCOUNTER — Other Ambulatory Visit: Payer: Self-pay

## 2020-05-05 ENCOUNTER — Ambulatory Visit: Payer: 59 | Admitting: Internal Medicine

## 2020-05-05 VITALS — BP 130/90 | HR 76 | Ht 64.0 in | Wt 152.0 lb

## 2020-05-05 DIAGNOSIS — R7302 Impaired glucose tolerance (oral): Secondary | ICD-10-CM

## 2020-05-05 DIAGNOSIS — F419 Anxiety disorder, unspecified: Secondary | ICD-10-CM

## 2020-05-05 DIAGNOSIS — I10 Essential (primary) hypertension: Secondary | ICD-10-CM

## 2020-05-05 DIAGNOSIS — F329 Major depressive disorder, single episode, unspecified: Secondary | ICD-10-CM

## 2020-05-05 DIAGNOSIS — M85852 Other specified disorders of bone density and structure, left thigh: Secondary | ICD-10-CM

## 2020-05-05 NOTE — Progress Notes (Signed)
   Subjective:    Patient ID: Nicole Bowers, female    DOB: Nov 08, 1955, 64 y.o.   MRN: [id number]  HPI  Here today to discuss recent bone density results.  She is very physically active grooming dogs.  Recently has been has been talking with a well-known poodle breeder and may have a chance to show a poodle in [facility] dog shows in the near future.  This would require being very physically active in the show ring with a dog in addition to grooming and traveling.  Patient had vitamin D level checked May 14, 2019 and level was stable at 59. She currently is on high-dose vitamin D 50,000 units weekly.  This was started by Dellie Catholic when she was having some chronic left knee pain in 2019.  I think she should continue with this dose.  We did not check vitamin D level recently due to expense.  She had a bone density study in 2018.  T score in the left femur was -1.8 and in the LS spine -1.2.  Radiologist at that time felt she had lost significant bone density compared to 215.  We discussed oral bisphosphonates at that time.  We started her on Boniva but she currently is not taking that.  She has a history of essential hypertension which is stable on current regimen.  Recent bone density study April 24, 2020 showed T score in the AP spine -1.0 slightly improved from 2018 when it was -1.1.  Left femur T score is -2.0 and previously was -1.8 in 2018.  We talked about options regarding Boniva, Reclast, Prolia.  She is interested in pursuing Prolia.  Boniva was not convenient to take even though it was only monthly and had to be on empty stomach for an hour prior to eating and really did not agree with the patient schedule.   Review of Systems see above     Objective:   Physical Exam  Blood pressure 130/90, pulse 76 pulse oximetry 97% weight 152 pounds  Discussion regarding hypertension, vitamin D supplement and osteopenia  She just got back from dog showed late last night.  She will continue to  monitor her blood pressure on current regimen.      Assessment & Plan:  Essential hypertension-continue to monitor blood pressure at home and let me know if elevated persistently elevated.  No change in antihypertensive medication regimen.  Continue Altace and amlodipine  Vitamin D therapy to be continued high-dose 50,000 units weekly.  Vitamin D level not checked in conjunction with this visit due to expense.  Level was 59 in August 2020.  History of left knee pain treated by Dr. Loralee Pacas  Osteopenia with lowest T score -2.0 on recent bone density study.  She is interested in Prolia therapy and this will need to be approved.  We will get back to her regarding this.  Impaired glucose tolerance-stable with diet  History of anxiety depression treated with Zoloft

## 2020-05-11 MED ORDER — ERGOCALCIFEROL 1.25 MG (50000 UT) PO CAPS: 50000.0000 [IU] | ORAL_CAPSULE | ORAL | 3 refills | Status: DC

## 2020-05-11 NOTE — Patient Instructions (Signed)
Patient is interested in Prolia therapy for osteopenia and this will need to be approved.  Continue high-dose vitamin D weekly for now.  Office staff will need to submit application for Prolia.

## 2020-05-16 ENCOUNTER — Other Ambulatory Visit: Payer: 59 | Admitting: Internal Medicine

## 2020-05-19 ENCOUNTER — Encounter: Payer: 59 | Admitting: Internal Medicine

## 2020-05-21 ENCOUNTER — Other Ambulatory Visit: Payer: Self-pay | Admitting: Internal Medicine

## 2020-05-21 NOTE — Telephone Encounter (Signed)
Looks like it was Flonase that was recently filled as well as vitamin D. Pend this for 6 months and I will sign Friday.

## 2020-05-21 NOTE — Telephone Encounter (Signed)
No, it was last refilled in March.

## 2020-05-21 NOTE — Telephone Encounter (Signed)
Did I not just do this????

## 2020-05-21 NOTE — Telephone Encounter (Signed)
Received Fax RX request from  Pharmacy -  [id number] - [place], [place] - [address] Phone:  [phone]  Fax:  [phone]       Medication - ALPRAZolam Prudy Feeler) 0.5 MG tablet   Last Refill - 04/22/20  Last OV - 05/05/20  Last CPE - 05/13/20  Next Appointment - 06/16/20

## 2020-05-22 MED ORDER — ALPRAZOLAM 0.5 MG PO TABS: ORAL_TABLET | ORAL | 5 refills | Status: DC

## 2020-06-06 ENCOUNTER — Other Ambulatory Visit: Payer: 59 | Admitting: Internal Medicine

## 2020-06-06 ENCOUNTER — Other Ambulatory Visit: Payer: Self-pay

## 2020-06-06 DIAGNOSIS — M85852 Other specified disorders of bone density and structure, left thigh: Secondary | ICD-10-CM

## 2020-06-06 DIAGNOSIS — E7800 Pure hypercholesterolemia, unspecified: Secondary | ICD-10-CM

## 2020-06-06 DIAGNOSIS — R7302 Impaired glucose tolerance (oral): Secondary | ICD-10-CM

## 2020-06-06 DIAGNOSIS — I10 Essential (primary) hypertension: Secondary | ICD-10-CM

## 2020-06-06 DIAGNOSIS — F321 Major depressive disorder, single episode, moderate: Secondary | ICD-10-CM

## 2020-06-06 DIAGNOSIS — F411 Generalized anxiety disorder: Secondary | ICD-10-CM

## 2020-06-06 DIAGNOSIS — Z0000 Encounter for general adult medical examination without abnormal findings: Secondary | ICD-10-CM

## 2020-06-06 DIAGNOSIS — M4316 Spondylolisthesis, lumbar region: Secondary | ICD-10-CM

## 2020-06-07 LAB — CBC WITH DIFFERENTIAL/PLATELET
Absolute Monocytes: 365 cells/uL (ref 200–950)
Basophils Absolute: 12 cells/uL (ref 0–200)
Basophils Relative: 0.2 %
Eosinophils Absolute: 99 cells/uL (ref 15–500)
Eosinophils Relative: 1.7 %
HCT: 45.2 % — ABNORMAL HIGH (ref 35.0–45.0)
Hemoglobin: 14.8 g/dL (ref 11.7–15.5)
Lymphs Abs: 1815 cells/uL (ref 850–3900)
MCH: 28.2 pg (ref 27.0–33.0)
MCHC: 32.7 g/dL (ref 32.0–36.0)
MCV: 86.3 fL (ref 80.0–100.0)
MPV: 9.9 fL (ref 7.5–12.5)
Monocytes Relative: 6.3 %
Neutro Abs: 3509 cells/uL (ref 1500–7800)
Neutrophils Relative %: 60.5 %
Platelets: 243 10*3/uL (ref 140–400)
RBC: 5.24 10*6/uL — ABNORMAL HIGH (ref 3.80–5.10)
RDW: 13.1 % (ref 11.0–15.0)
Total Lymphocyte: 31.3 %
WBC: 5.8 10*3/uL (ref 3.8–10.8)

## 2020-06-07 LAB — LIPID PANEL
Cholesterol: 190 mg/dL (ref <200)
HDL: 72 mg/dL (ref >=50)
LDL Cholesterol (Calc): 104 mg/dL (calc) — ABNORMAL HIGH
Non-HDL Cholesterol (Calc): 118 mg/dL (calc) (ref <130)
Total CHOL/HDL Ratio: 2.6 (calc) (ref <5.0)
Triglycerides: 53 mg/dL (ref <150)

## 2020-06-07 LAB — COMPLETE METABOLIC PANEL WITH GFR
AG Ratio: 1.8 (calc) (ref 1.0–2.5)
ALT: 46 U/L — ABNORMAL HIGH (ref 6–29)
AST: 27 U/L (ref 10–35)
Albumin: 4.5 g/dL (ref 3.6–5.1)
Alkaline phosphatase (APISO): 135 U/L (ref 37–153)
BUN: 19 mg/dL (ref 7–25)
CO2: 27 mmol/L (ref 20–32)
Calcium: 10 mg/dL (ref 8.6–10.4)
Chloride: 105 mmol/L (ref 98–110)
Creat: 0.78 mg/dL (ref 0.50–0.99)
GFR, Est African American: 94 mL/min/{1.73_m2} (ref >=60)
GFR, Est Non African American: 81 mL/min/{1.73_m2} (ref >=60)
Globulin: 2.5 g/dL (calc) (ref 1.9–3.7)
Glucose, Bld: 113 mg/dL — ABNORMAL HIGH (ref 65–99)
Potassium: 4.8 mmol/L (ref 3.5–5.3)
Sodium: 141 mmol/L (ref 135–146)
Total Bilirubin: 0.4 mg/dL (ref 0.2–1.2)
Total Protein: 7 g/dL (ref 6.1–8.1)

## 2020-06-07 LAB — HEMOGLOBIN A1C
Hgb A1c MFr Bld: 5.7 % of total Hgb — ABNORMAL HIGH (ref <5.7)
Mean Plasma Glucose: 117 (calc)
eAG (mmol/L): 6.5 (calc)

## 2020-06-07 LAB — TSH: TSH: 1.51 mIU/L (ref 0.40–4.50)

## 2020-06-16 ENCOUNTER — Other Ambulatory Visit: Payer: Self-pay

## 2020-06-16 ENCOUNTER — Ambulatory Visit (INDEPENDENT_AMBULATORY_CARE_PROVIDER_SITE_OTHER): Payer: 59 | Admitting: Internal Medicine

## 2020-06-16 ENCOUNTER — Encounter: Payer: Self-pay | Admitting: Internal Medicine

## 2020-06-16 VITALS — BP 102/80 | HR 72 | Ht 64.0 in | Wt 154.0 lb

## 2020-06-16 DIAGNOSIS — F419 Anxiety disorder, unspecified: Secondary | ICD-10-CM

## 2020-06-16 DIAGNOSIS — Z0000 Encounter for general adult medical examination without abnormal findings: Secondary | ICD-10-CM

## 2020-06-16 DIAGNOSIS — M8580 Other specified disorders of bone density and structure, unspecified site: Secondary | ICD-10-CM | POA: Diagnosis not present

## 2020-06-16 DIAGNOSIS — F329 Major depressive disorder, single episode, unspecified: Secondary | ICD-10-CM

## 2020-06-16 DIAGNOSIS — I10 Essential (primary) hypertension: Secondary | ICD-10-CM

## 2020-06-16 DIAGNOSIS — R7302 Impaired glucose tolerance (oral): Secondary | ICD-10-CM

## 2020-06-16 LAB — POCT URINALYSIS DIPSTICK
Appearance: NEGATIVE
Bilirubin, UA: NEGATIVE
Blood, UA: NEGATIVE
Glucose, UA: NEGATIVE
Ketones, UA: NEGATIVE
Leukocytes, UA: NEGATIVE
Nitrite, UA: NEGATIVE
Odor: NEGATIVE
Protein, UA: NEGATIVE
Spec Grav, UA: 1.01 (ref 1.010–1.025)
Urobilinogen, UA: 0.2 E.U./dL
pH, UA: 6.5 (ref 5.0–8.0)
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
## 2020-06-26 ENCOUNTER — Encounter: Payer: Self-pay | Admitting: Internal Medicine

## 2020-06-26 NOTE — Patient Instructions (Addendum)
It was a pleasure to see you today.  We will see if we can get Prolia approved by insurance company.  Continue current medications and follow-up in 6 months.  Have third Covid vaccine when available.  Consider flu vaccine since you are traveling.

## 2020-07-02 ENCOUNTER — Telehealth: Payer: Self-pay | Admitting: Internal Medicine

## 2020-07-02 NOTE — Telephone Encounter (Signed)
°  Received fax that Prolia 60mg  has been approved  Authorization [id number]  Provider [facility]   Good 06/25/2020 to 06/25/2021  Any questions contact customer service on [place] ID card pharmacy coverage.

## 2020-08-12 ENCOUNTER — Other Ambulatory Visit: Payer: Self-pay | Admitting: Internal Medicine

## 2020-10-21 ENCOUNTER — Other Ambulatory Visit: Payer: Self-pay | Admitting: Internal Medicine

## 2020-11-19 ENCOUNTER — Other Ambulatory Visit: Payer: Self-pay | Admitting: Internal Medicine

## 2020-12-12 ENCOUNTER — Other Ambulatory Visit: Payer: 59 | Admitting: Internal Medicine

## 2020-12-12 ENCOUNTER — Other Ambulatory Visit: Payer: Self-pay

## 2020-12-12 DIAGNOSIS — M8580 Other specified disorders of bone density and structure, unspecified site: Secondary | ICD-10-CM

## 2020-12-12 DIAGNOSIS — E7800 Pure hypercholesterolemia, unspecified: Secondary | ICD-10-CM

## 2020-12-12 DIAGNOSIS — R7302 Impaired glucose tolerance (oral): Secondary | ICD-10-CM

## 2020-12-12 DIAGNOSIS — I10 Essential (primary) hypertension: Secondary | ICD-10-CM

## 2020-12-13 LAB — HEPATIC FUNCTION PANEL
AG Ratio: 1.8 (calc) (ref 1.0–2.5)
ALT: 24 U/L (ref 6–29)
AST: 18 U/L (ref 10–35)
Albumin: 4.7 g/dL (ref 3.6–5.1)
Alkaline phosphatase (APISO): 115 U/L (ref 37–153)
Bilirubin, Direct: 0.1 mg/dL (ref 0.0–0.2)
Globulin: 2.6 g/dL (calc) (ref 1.9–3.7)
Indirect Bilirubin: 0.3 mg/dL (calc) (ref 0.2–1.2)
Total Bilirubin: 0.4 mg/dL (ref 0.2–1.2)
Total Protein: 7.3 g/dL (ref 6.1–8.1)

## 2020-12-13 LAB — HEMOGLOBIN A1C
Hgb A1c MFr Bld: 5.6 % of total Hgb (ref <5.7)
Mean Plasma Glucose: 114 mg/dL
eAG (mmol/L): 6.3 mmol/L

## 2020-12-13 LAB — LIPID PANEL
Cholesterol: 204 mg/dL — ABNORMAL HIGH (ref <200)
HDL: 86 mg/dL (ref >=50)
LDL Cholesterol (Calc): 105 mg/dL (calc) — ABNORMAL HIGH
Non-HDL Cholesterol (Calc): 118 mg/dL (calc) (ref <130)
Total CHOL/HDL Ratio: 2.4 (calc) (ref <5.0)
Triglycerides: 51 mg/dL (ref <150)

## 2020-12-15 ENCOUNTER — Ambulatory Visit: Payer: 59 | Admitting: Internal Medicine

## 2020-12-15 ENCOUNTER — Other Ambulatory Visit: Payer: Self-pay

## 2020-12-15 ENCOUNTER — Encounter: Payer: Self-pay | Admitting: Internal Medicine

## 2020-12-15 VITALS — BP 130/80 | HR 85 | Ht 64.0 in | Wt 153.0 lb

## 2020-12-15 DIAGNOSIS — R7302 Impaired glucose tolerance (oral): Secondary | ICD-10-CM | POA: Diagnosis not present

## 2020-12-15 DIAGNOSIS — F419 Anxiety disorder, unspecified: Secondary | ICD-10-CM

## 2020-12-15 DIAGNOSIS — E7800 Pure hypercholesterolemia, unspecified: Secondary | ICD-10-CM

## 2020-12-15 DIAGNOSIS — M8580 Other specified disorders of bone density and structure, unspecified site: Secondary | ICD-10-CM

## 2020-12-15 DIAGNOSIS — F32A Depression, unspecified: Secondary | ICD-10-CM

## 2020-12-15 DIAGNOSIS — I10 Essential (primary) hypertension: Secondary | ICD-10-CM

## 2020-12-15 NOTE — Patient Instructions (Addendum)
Patient will contact insurance company regarding why Prolia has not been approved.  Continue with current medications as previously prescribed.  Continue to watch diet and exercise and follow-up in 6 months for health maintenance exam.  It was a pleasure to see you today.  Labs are stable on current regimen.

## 2020-12-15 NOTE — Progress Notes (Signed)
   Subjective:    Patient ID: Nicole Bowers, female    DOB: 12/26/1955, 65 y.o.   MRN: [id number]  HPI 65 year old Female seen for 6 month recheck.  History of osteopenia with bone density study in 2018 showing a T score in the left femur of -1.8 in the left femur and -1.2 in the LS spine.  She took Boniva for a while but then discontinued it.  She had bone density study April 24, 2020 showing T score in the AP spine of -1.0 slightly improved from 2018 when it was -1.1.  Left femur T score was -2.0 and previously was -1.8 in 2018.  At that time we had a discussion about Boniva, Reclast and Prolia.  She was interested in Prolia.  However it has been difficult to get this approved by her insurance company.  Therefore she is off of any bone sparing medication whatsoever.  Has had some issues with her children that she discussed with me today.  On a good note, she showed a toy poodle to a Championship and is very pleased.  Her husband is doing well.  She remains on Altace and amlodipine for hypertension.  Take Zoloft for history of depression.  Is on vitamin D supplement 50,000 units weekly.  We have not checked a level recently due to expense.  It is not covered by her insurance apparently.    She takes Xanax for anxiety.  She has some follow-up to do with her insurance company to get qualified for Prolia.  Her hemoglobin A1c today is excellent at 5.6% and was 5.7% in September 2021.  Her lipid panel is excellent.  Total cholesterol is 204, HDL 86, triglycerides 51 and LDL slightly elevated at 105.  She is not on lipid-lowering medication.  Her blood pressure is excellent at 130/80.    Review of Systems see above other than situational stress no new complaints.  She is attending Al-Anon classes by 0 to help deal with her son who has alcohol issues.     Objective:   Physical Exam Blood pressure 130/80, pulse 85 regular pulse oximetry 97% weight 153 pounds height 5 feet 4 inches BMI  26.26 Chest clear to auscultation.  Cardiac exam regular rate and rhythm.  No lower extremity edema. 20 minutes discussion regarding these issues today.  She will continue with current regimen and try to resolve issue regarding Prolia with insurance company.  If she cannot she is welcome to go back on Boniva.     Assessment & Plan:  Osteopenia-try to get Prolia approved and if not she can go back on Boniva  Essential hypertension stable on current regimen ramipril and amlodipine  History of vitamin D deficiency treated with high-dose vitamin D weekly  Anxiety depression treated with Zoloft and alprazolam.  This is stable but has had some situational stress with her son  Glucose intolerance stable with diet alone and hemoglobin A1c is excellent at 5.6%  Pure hypercholesterolemia controlled with diet alone.  Does not want to be on statin medication.  Allergic rhinitis treated with Flonase nasal spray.  Plan: Return in 6 months for health maintenance exam.  She will check into why Prolia has not been approved by her insurance company.

## 2021-02-04 ENCOUNTER — Other Ambulatory Visit: Payer: Self-pay

## 2021-02-11 ENCOUNTER — Other Ambulatory Visit: Payer: Self-pay | Admitting: Internal Medicine

## 2021-02-18 ENCOUNTER — Telehealth: Payer: Self-pay | Admitting: Internal Medicine

## 2021-02-18 NOTE — Telephone Encounter (Signed)
Called and gave patient information

## 2021-02-18 NOTE — Telephone Encounter (Signed)
I like Ellis Savage at [facility]. You have to leave a message and I think they require a deposit in advance of appointment.

## 2021-02-18 NOTE — Telephone Encounter (Signed)
Nicole Bowers [phone]  Nicole called said that she has 2 children in crisis and husband that's health is not good and she feels like she needs someone besides family to talk to. Who would you recommend?

## 2021-03-03 ENCOUNTER — Telehealth: Payer: Self-pay | Admitting: Internal Medicine

## 2021-03-03 NOTE — Telephone Encounter (Signed)
I think the best option is for patient to see Endocrinologist of choice and discuss these options. I have no experience with Reclast or Forteo. Nicole Bowers is a tablet taken monthly.

## 2021-03-03 NOTE — Telephone Encounter (Signed)
Nicole Bowers [phone]  [name] called after not hearing anything from her insurance company about Prolia, and she said she had spoke with pharmacy company and they said that Prolia is not covered, that we would have to start all over again and that Boniva, Forteoftoo and Reclasp are covered but prior authorizations would have to be done on which ever was chosen.

## 2021-03-03 NOTE — Telephone Encounter (Signed)
I called her insurance company as well because I had been told on 02/13/2021 that Prolia was approved by [facility] [phone], Reference # [id number]. Her RX insurance changed in 09/2020 and will not honor the approval that I received last year. The process has to start over and from what I understand they probably will not approve Prolia because that is not on their preferred list. They want patient to try Boniva, Foreoftoo or Reclasp.

## 2021-03-05 NOTE — Telephone Encounter (Signed)
Referral placed.

## 2021-05-11 ENCOUNTER — Other Ambulatory Visit: Payer: Self-pay | Admitting: Internal Medicine

## 2021-05-18 ENCOUNTER — Other Ambulatory Visit: Payer: Self-pay | Admitting: Internal Medicine

## 2021-06-02 ENCOUNTER — Other Ambulatory Visit: Payer: Self-pay | Admitting: Internal Medicine

## 2021-06-08 ENCOUNTER — Ambulatory Visit (INDEPENDENT_AMBULATORY_CARE_PROVIDER_SITE_OTHER): Payer: PRIVATE HEALTH INSURANCE | Admitting: Endocrinology

## 2021-06-08 ENCOUNTER — Encounter: Payer: Self-pay | Admitting: Endocrinology

## 2021-06-08 ENCOUNTER — Other Ambulatory Visit: Payer: Self-pay

## 2021-06-08 VITALS — BP 140/78 | HR 86 | Ht 64.0 in | Wt 151.0 lb

## 2021-06-08 DIAGNOSIS — E559 Vitamin D deficiency, unspecified: Secondary | ICD-10-CM

## 2021-06-08 DIAGNOSIS — M8580 Other specified disorders of bone density and structure, unspecified site: Secondary | ICD-10-CM

## 2021-06-08 LAB — VITAMIN D 25 HYDROXY (VIT D DEFICIENCY, FRACTURES): VITD: 75.31 ng/mL (ref 30.00–100.00)

## 2021-06-08 LAB — BASIC METABOLIC PANEL
BUN: 20 mg/dL (ref 6–23)
CO2: 27 mEq/L (ref 19–32)
Calcium: 10.4 mg/dL (ref 8.4–10.5)
Chloride: 104 mEq/L (ref 96–112)
Creatinine, Ser: 0.72 mg/dL (ref 0.40–1.20)
GFR: 89 mL/min (ref >60.00)
Glucose, Bld: 99 mg/dL (ref 70–99)
Potassium: 5.3 mEq/L — ABNORMAL HIGH (ref 3.5–5.1)
Sodium: 140 mEq/L (ref 135–145)

## 2021-06-08 LAB — TSH: TSH: 1.18 u[IU]/mL (ref 0.35–5.50)
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
## 2021-06-08 NOTE — Patient Instructions (Addendum)
Blood tests are requested for you today.  We'll let you know about the results.  We'll schedule the Reclast.  you will receive a phone call, about a day and time for an appointment.   Please come back for a follow-up appointment in 1 year.

## 2021-06-19 ENCOUNTER — Other Ambulatory Visit: Payer: 59 | Admitting: Internal Medicine

## 2021-06-22 ENCOUNTER — Encounter: Payer: 59 | Admitting: Internal Medicine

## 2021-06-29 ENCOUNTER — Other Ambulatory Visit: Payer: Self-pay

## 2021-06-29 ENCOUNTER — Ambulatory Visit (INDEPENDENT_AMBULATORY_CARE_PROVIDER_SITE_OTHER): Payer: PRIVATE HEALTH INSURANCE | Admitting: Nutrition

## 2021-06-29 DIAGNOSIS — M8580 Other specified disorders of bone density and structure, unspecified site: Secondary | ICD-10-CM | POA: Diagnosis not present

## 2021-06-30 NOTE — Patient Instructions (Signed)
Continue to take your calcium and Vit. D per Dr. George Hugh orders Drink 4-6 glasses of water today.

## 2021-06-30 NOTE — Progress Notes (Signed)
Per Dr. George Hugh note on 06/08/21, and after a discussion on how this medication works, and side effects, patient signed the consent.  An IV was started in patient's right arm and 2 ccs of normal saline was infused.  Site showed no signs of infiltration.  5mg . Of Zolendronic acid was then started at 11:45 and infused until 12:15PM. Patient denied discomfort at the IV site or dizziness The line was flushed with normal saline again and then D/Cd.  Site showed no signes of redness or swelling.  Patient was encouraged to continue her calcium and Vit D, per Dr. instructions.  She was also encouraged to drink 4-6 glasses of water today, and she agreed to do this.

## 2021-08-08 ENCOUNTER — Other Ambulatory Visit: Payer: Self-pay | Admitting: Internal Medicine

## 2021-09-24 ENCOUNTER — Other Ambulatory Visit: Payer: Self-pay

## 2021-09-24 ENCOUNTER — Other Ambulatory Visit: Payer: 59 | Admitting: Internal Medicine

## 2021-09-24 DIAGNOSIS — Z1329 Encounter for screening for other suspected endocrine disorder: Secondary | ICD-10-CM

## 2021-09-24 DIAGNOSIS — F32A Depression, unspecified: Secondary | ICD-10-CM

## 2021-09-24 DIAGNOSIS — E7800 Pure hypercholesterolemia, unspecified: Secondary | ICD-10-CM

## 2021-09-24 DIAGNOSIS — I10 Essential (primary) hypertension: Secondary | ICD-10-CM

## 2021-09-25 LAB — COMPLETE METABOLIC PANEL WITH GFR
AG Ratio: 1.7 (calc) (ref 1.0–2.5)
ALT: 49 U/L — ABNORMAL HIGH (ref 6–29)
AST: 26 U/L (ref 10–35)
Albumin: 4.6 g/dL (ref 3.6–5.1)
Alkaline phosphatase (APISO): 85 U/L (ref 37–153)
BUN: 18 mg/dL (ref 7–25)
CO2: 29 mmol/L (ref 20–32)
Calcium: 10 mg/dL (ref 8.6–10.4)
Chloride: 104 mmol/L (ref 98–110)
Creat: 0.82 mg/dL (ref 0.50–1.05)
Globulin: 2.7 g/dL (calc) (ref 1.9–3.7)
Glucose, Bld: 99 mg/dL (ref 65–99)
Potassium: 5.2 mmol/L (ref 3.5–5.3)
Sodium: 142 mmol/L (ref 135–146)
Total Bilirubin: 0.3 mg/dL (ref 0.2–1.2)
Total Protein: 7.3 g/dL (ref 6.1–8.1)
eGFR: 80 mL/min/{1.73_m2} (ref >=60)

## 2021-09-25 LAB — CBC WITH DIFFERENTIAL/PLATELET
Absolute Monocytes: 354 cells/uL (ref 200–950)
Basophils Absolute: 12 cells/uL (ref 0–200)
Basophils Relative: 0.2 %
Eosinophils Absolute: 70 cells/uL (ref 15–500)
Eosinophils Relative: 1.2 %
HCT: 45.5 % — ABNORMAL HIGH (ref 35.0–45.0)
Hemoglobin: 14.7 g/dL (ref 11.7–15.5)
Lymphs Abs: 2065 cells/uL (ref 850–3900)
MCH: 28 pg (ref 27.0–33.0)
MCHC: 32.3 g/dL (ref 32.0–36.0)
MCV: 86.7 fL (ref 80.0–100.0)
MPV: 10.2 fL (ref 7.5–12.5)
Monocytes Relative: 6.1 %
Neutro Abs: 3300 cells/uL (ref 1500–7800)
Neutrophils Relative %: 56.9 %
Platelets: 249 10*3/uL (ref 140–400)
RBC: 5.25 10*6/uL — ABNORMAL HIGH (ref 3.80–5.10)
RDW: 12.7 % (ref 11.0–15.0)
Total Lymphocyte: 35.6 %
WBC: 5.8 10*3/uL (ref 3.8–10.8)

## 2021-09-25 LAB — TSH: TSH: 2.01 mIU/L (ref 0.40–4.50)

## 2021-09-25 LAB — LIPID PANEL
Cholesterol: 239 mg/dL — ABNORMAL HIGH (ref <200)
HDL: 89 mg/dL (ref >=50)
LDL Cholesterol (Calc): 134 mg/dL (calc) — ABNORMAL HIGH
Non-HDL Cholesterol (Calc): 150 mg/dL (calc) — ABNORMAL HIGH (ref <130)
Total CHOL/HDL Ratio: 2.7 (calc) (ref <5.0)
Triglycerides: 67 mg/dL (ref <150)

## 2021-09-29 ENCOUNTER — Encounter: Payer: Self-pay | Admitting: Internal Medicine

## 2021-09-29 ENCOUNTER — Ambulatory Visit (INDEPENDENT_AMBULATORY_CARE_PROVIDER_SITE_OTHER): Payer: 59 | Admitting: Internal Medicine

## 2021-09-29 ENCOUNTER — Other Ambulatory Visit: Payer: Self-pay

## 2021-09-29 VITALS — BP 128/70 | HR 88 | Temp 97.0°F | Ht 63.5 in | Wt 150.0 lb

## 2021-09-29 DIAGNOSIS — R82998 Other abnormal findings in urine: Secondary | ICD-10-CM

## 2021-09-29 DIAGNOSIS — R7989 Other specified abnormal findings of blood chemistry: Secondary | ICD-10-CM | POA: Diagnosis not present

## 2021-09-29 DIAGNOSIS — F419 Anxiety disorder, unspecified: Secondary | ICD-10-CM | POA: Diagnosis not present

## 2021-09-29 DIAGNOSIS — M8580 Other specified disorders of bone density and structure, unspecified site: Secondary | ICD-10-CM

## 2021-09-29 DIAGNOSIS — F4321 Adjustment disorder with depressed mood: Secondary | ICD-10-CM

## 2021-09-29 DIAGNOSIS — Z0000 Encounter for general adult medical examination without abnormal findings: Secondary | ICD-10-CM | POA: Diagnosis not present

## 2021-09-29 DIAGNOSIS — I10 Essential (primary) hypertension: Secondary | ICD-10-CM | POA: Diagnosis not present

## 2021-09-29 DIAGNOSIS — F32A Depression, unspecified: Secondary | ICD-10-CM

## 2021-09-29 DIAGNOSIS — E7800 Pure hypercholesterolemia, unspecified: Secondary | ICD-10-CM

## 2021-09-29 LAB — POCT URINALYSIS DIPSTICK
Bilirubin, UA: NEGATIVE
Glucose, UA: NEGATIVE
Ketones, UA: NEGATIVE
Nitrite, UA: NEGATIVE
Protein, UA: NEGATIVE
Spec Grav, UA: 1.02 (ref 1.010–1.025)
Urobilinogen, UA: 0.2 E.U./dL
pH, UA: 5 (ref 5.0–8.0)

## 2021-09-29 MED ORDER — CLOTRIMAZOLE-BETAMETHASONE 1-0.05 % EX CREA: 1.0000 "application " | TOPICAL_CREAM | Freq: Every day | CUTANEOUS | 0 refills | Status: DC

## 2021-09-29 NOTE — Progress Notes (Signed)
° °  Subjective:    Patient ID: Nicole Bowers, female    DOB: [birth year], 66 y.o.   MRN: [id number]  HPI 66 year Female for health maintenance exam and evaluation of medical issues.  Just recently lost her son due to complications of alcohol use.  She has been attending grief counseling.  She is doing quite well considering.  She has a history of impaired glucose tolerance.  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  [facility].  History of L4-L5 spondylolisthesis.  History of hypertension treated with amlodipine and ramipril.  History of anxiety and depression treated with Xanax and Zoloft.  Mild elevation of SGPT at 49 which has been noted before.  This will continue to be followed.  CBC is stable.  Total cholesterol has increased from March 2022 from 204 to 239.  LDL has increased from 10 5-1 34 but this is understandable with recent situational stress.  HDL excellent at 89 and triglycerides excellent at 67.  TSH is normal at 2.01.  I think these labs can be followed up in a few months.  Dipstick UA is abnormal but culture grew mixed flora.  She has no urinary symptoms.  Dr. Seymour Bars is GYN physician.  History of musculoskeletal pain related to physical labor and treated with ibuprofen.  Social history: Non-smoker.  Social alcohol consumption.  Trying to grams dogs.  She was born in [place] and raised in [place].  She is married.  Has smoked for some 30 years.  Past medical history: History of migraine headaches.  Had cryoablation in 2007.  Patient reports this was for menorrhagia.  History of stress echocardiogram in [place] in 2006 with average exercise capacity and normal left ventricular systolic function.  History of acne treated with doxycycline and Cleocin topical solution.  Colonoscopy up to date with RTC 2029. Last done 2019. Review of Systems Has external gential irritation.  Has abnormal urine specimen and culture was ordered. Will treat with Lotrisone twice a day.  Does not need Pap today. Patient declined tetanus immunization untl another time.     Objective:   Physical Exam Blood pressure excellent 128/70 pulse 98 regular pulse oximetry 97% weight 150 pounds BMI 26.15  Skin: Warm and dry.  Affect is sad as expected.  No cervical adenopathy.  No thyromegaly.  No JVD or carotid bruits.  Chest is clear to auscultation.  Cardiac exam: Regular rate and rhythm without ectopy or murmurs.  Abdomen: Soft nondistended without hepatosplenomegaly masses or tenderness.  GYN exam deferred to Dr. Judie Grieve.  Extremities are without edema.  Brief neurological exam is intact without focal deficits.       Assessment & Plan:  Grief reaction-I think doing very well considering loss of son recently due to alcoholism.  Receiving grief counseling.  Pure hypercholesterolemia-continue to follow  Essential hypertension stable on 2 drug regimen  Osteopenia-now being treated with Prolia by endocrinology  Vitamin D deficiency-being treated by endocrinology  History of allergic rhinitis  History of depression treated with Zoloft 100 mg daily  Elevated SGPT-mild  Plan: Continue current medications and follow-up in 6 months.  She is going to have ultrasound of the liver and gallbladder due to mildly elevated SGPT of 49.  This is been intermittently elevated over the past few years.  She is concerned about this.  Mammogram ordered

## 2021-09-30 LAB — URINE CULTURE
MICRO NUMBER:: 12820633
SPECIMEN QUALITY:: ADEQUATE

## 2021-10-11 NOTE — Patient Instructions (Addendum)
Patient is going to have ultrasound of gallbladder and liver due to mildly elevated SGPT.  She will continue current medications and follow-up in 6 months.  I agree with grief counseling.  We are sorry to hear about the loss of her son.  Lotrisone cream ordered.

## 2021-10-19 ENCOUNTER — Ambulatory Visit
Admission: RE | Admit: 2021-10-19 | Discharge: 2021-10-19 | Disposition: A | Discharge status: Home / Self Care | Payer: PRIVATE HEALTH INSURANCE | Source: Ambulatory Visit | Attending: Internal Medicine | Admitting: Internal Medicine

## 2021-10-19 ENCOUNTER — Other Ambulatory Visit: Payer: Self-pay

## 2021-10-19 ENCOUNTER — Telehealth: Payer: Self-pay | Admitting: Internal Medicine

## 2021-10-19 MED ORDER — ALPRAZOLAM 0.5 MG PO TABS: ORAL_TABLET | ORAL | 5 refills | Status: DC

## 2021-10-19 NOTE — Telephone Encounter (Signed)
Refill Xanax one half to one tab q am and one tab hs #60 with 5 refills MJB,MD

## 2021-10-21 ENCOUNTER — Other Ambulatory Visit: Payer: Self-pay | Admitting: Internal Medicine

## 2021-10-21 DIAGNOSIS — R928 Other abnormal and inconclusive findings on diagnostic imaging of breast: Secondary | ICD-10-CM

## 2021-11-04 ENCOUNTER — Other Ambulatory Visit: Payer: Self-pay | Admitting: Internal Medicine

## 2021-11-16 ENCOUNTER — Other Ambulatory Visit: Payer: Self-pay | Admitting: Internal Medicine

## 2021-11-16 ENCOUNTER — Ambulatory Visit
Admission: RE | Admit: 2021-11-16 | Discharge: 2021-11-16 | Disposition: A | Discharge status: Home / Self Care | Payer: PRIVATE HEALTH INSURANCE | Source: Ambulatory Visit | Attending: Internal Medicine | Admitting: Internal Medicine

## 2021-11-16 ENCOUNTER — Ambulatory Visit: Payer: PRIVATE HEALTH INSURANCE

## 2021-11-16 DIAGNOSIS — R928 Other abnormal and inconclusive findings on diagnostic imaging of breast: Secondary | ICD-10-CM

## 2021-11-16 DIAGNOSIS — N6320 Unspecified lump in the left breast, unspecified quadrant: Secondary | ICD-10-CM

## 2022-01-18 ENCOUNTER — Ambulatory Visit (INDEPENDENT_AMBULATORY_CARE_PROVIDER_SITE_OTHER): Payer: PRIVATE HEALTH INSURANCE | Admitting: Endocrinology

## 2022-01-18 DIAGNOSIS — M810 Age-related osteoporosis without current pathological fracture: Secondary | ICD-10-CM | POA: Insufficient documentation

## 2022-01-18 NOTE — Patient Instructions (Addendum)
Let's recheck the bone density.  you will receive a phone call, about a day and time for an appointment. ?You should have an endocrinology follow-up appointment in 1 year.   ? ? ? ?
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
## 2022-01-19 ENCOUNTER — Encounter: Payer: Self-pay | Admitting: Endocrinology

## 2022-01-20 ENCOUNTER — Other Ambulatory Visit: Payer: Self-pay | Admitting: Endocrinology

## 2022-01-20 DIAGNOSIS — M810 Age-related osteoporosis without current pathological fracture: Secondary | ICD-10-CM

## 2022-01-22 ENCOUNTER — Other Ambulatory Visit: Payer: Self-pay | Admitting: Endocrinology

## 2022-01-22 ENCOUNTER — Ambulatory Visit (INDEPENDENT_AMBULATORY_CARE_PROVIDER_SITE_OTHER)
Admission: RE | Admit: 2022-01-22 | Discharge: 2022-01-22 | Disposition: A | Discharge status: Home / Self Care | Payer: PRIVATE HEALTH INSURANCE | Source: Ambulatory Visit | Attending: Endocrinology | Admitting: Endocrinology

## 2022-01-22 DIAGNOSIS — M810 Age-related osteoporosis without current pathological fracture: Secondary | ICD-10-CM

## 2022-02-07 ENCOUNTER — Other Ambulatory Visit: Payer: Self-pay | Admitting: Internal Medicine

## 2022-04-09 ENCOUNTER — Other Ambulatory Visit: Payer: 59

## 2022-04-09 DIAGNOSIS — E7800 Pure hypercholesterolemia, unspecified: Secondary | ICD-10-CM

## 2022-04-09 DIAGNOSIS — R7989 Other specified abnormal findings of blood chemistry: Secondary | ICD-10-CM

## 2022-04-12 ENCOUNTER — Encounter: Payer: Self-pay | Admitting: Internal Medicine

## 2022-04-12 ENCOUNTER — Ambulatory Visit: Payer: 59 | Admitting: Internal Medicine

## 2022-04-12 VITALS — BP 112/82 | HR 71 | Temp 97.3°F | Wt 157.2 lb

## 2022-04-12 DIAGNOSIS — I10 Essential (primary) hypertension: Secondary | ICD-10-CM

## 2022-04-12 DIAGNOSIS — E7800 Pure hypercholesterolemia, unspecified: Secondary | ICD-10-CM | POA: Diagnosis not present

## 2022-04-12 DIAGNOSIS — F4321 Adjustment disorder with depressed mood: Secondary | ICD-10-CM

## 2022-04-12 DIAGNOSIS — F419 Anxiety disorder, unspecified: Secondary | ICD-10-CM

## 2022-04-12 DIAGNOSIS — E785 Hyperlipidemia, unspecified: Secondary | ICD-10-CM | POA: Diagnosis not present

## 2022-04-12 DIAGNOSIS — M8580 Other specified disorders of bone density and structure, unspecified site: Secondary | ICD-10-CM

## 2022-04-12 DIAGNOSIS — F32A Depression, unspecified: Secondary | ICD-10-CM

## 2022-04-12 NOTE — Addendum Note (Signed)
Addended by: Jama Flavors on: 04/12/2022 09:36 AM   Modules accepted: Orders

## 2022-04-12 NOTE — Progress Notes (Unsigned)
   Subjective:    Patient ID: Nicole Bowers, female    DOB: 1956-07-20, 66 y.o.   MRN: [id number]  HPI 66 year old Female seen for follow up. Seeing grief counselot and goes to Parent Al-Anon group.    Review of Systems     Objective:   Physical Exam        Assessment & Plan:

## 2022-04-13 LAB — LIPID PANEL
Cholesterol: 214 mg/dL — ABNORMAL HIGH (ref <200)
HDL: 87 mg/dL (ref >=50)
LDL Cholesterol (Calc): 114 mg/dL (calc) — ABNORMAL HIGH
Non-HDL Cholesterol (Calc): 127 mg/dL (calc) (ref <130)
Total CHOL/HDL Ratio: 2.5 (calc) (ref <5.0)
Triglycerides: 52 mg/dL (ref <150)

## 2022-04-13 LAB — HEPATIC FUNCTION PANEL
AG Ratio: 1.8 (calc) (ref 1.0–2.5)
ALT: 45 U/L — ABNORMAL HIGH (ref 6–29)
AST: 31 U/L (ref 10–35)
Albumin: 4.6 g/dL (ref 3.6–5.1)
Alkaline phosphatase (APISO): 89 U/L (ref 37–153)
Bilirubin, Direct: 0.1 mg/dL (ref 0.0–0.2)
Globulin: 2.6 g/dL (calc) (ref 1.9–3.7)
Indirect Bilirubin: 0.3 mg/dL (calc) (ref 0.2–1.2)
Total Bilirubin: 0.4 mg/dL (ref 0.2–1.2)
Total Protein: 7.2 g/dL (ref 6.1–8.1)

## 2022-04-13 LAB — GAMMA GT: GGT: 106 U/L — ABNORMAL HIGH (ref 3–65)

## 2022-04-14 NOTE — Patient Instructions (Addendum)
Patient has received 1 dose of Reclast per Dr. Everardo All.  She will return in January 2024 for health maintenance exam.  Bone density study scheduled for October 2023.  Continue with current medications.  Coronary calcium score ordered.  Has elevated GGT at 106.  SGPT is 45.  We can continue to monitor in 3 to 6 months or refer to gastroenterology for evaluation.

## 2022-04-24 ENCOUNTER — Other Ambulatory Visit: Payer: Self-pay | Admitting: Internal Medicine

## 2022-04-25 ENCOUNTER — Other Ambulatory Visit: Payer: Self-pay | Admitting: Internal Medicine

## 2022-04-27 ENCOUNTER — Other Ambulatory Visit (HOSPITAL_COMMUNITY): Payer: PRIVATE HEALTH INSURANCE

## 2022-04-29 ENCOUNTER — Ambulatory Visit (HOSPITAL_COMMUNITY)
Admission: RE | Admit: 2022-04-29 | Discharge: 2022-04-29 | Disposition: A | Discharge status: Home / Self Care | Payer: PRIVATE HEALTH INSURANCE | Source: Ambulatory Visit | Attending: Internal Medicine | Admitting: Internal Medicine

## 2022-04-29 DIAGNOSIS — E785 Hyperlipidemia, unspecified: Secondary | ICD-10-CM | POA: Insufficient documentation

## 2022-05-01 ENCOUNTER — Other Ambulatory Visit: Payer: Self-pay | Admitting: Internal Medicine

## 2022-05-17 ENCOUNTER — Ambulatory Visit
Admission: RE | Admit: 2022-05-17 | Discharge: 2022-05-17 | Disposition: A | Discharge status: Home / Self Care | Payer: PRIVATE HEALTH INSURANCE | Source: Ambulatory Visit | Attending: Internal Medicine | Admitting: Internal Medicine

## 2022-05-17 ENCOUNTER — Other Ambulatory Visit: Payer: Self-pay | Admitting: Internal Medicine

## 2022-05-17 DIAGNOSIS — N6320 Unspecified lump in the left breast, unspecified quadrant: Secondary | ICD-10-CM

## 2022-05-19 ENCOUNTER — Telehealth: Payer: Self-pay | Admitting: Internal Medicine

## 2022-05-19 NOTE — Telephone Encounter (Signed)
Nicole Bowers [phone]  [name] called to say she accidentally dropped her below medication in the sink thi morning, she retrieved most of them. She called pharmacy and they let her know she could not get them filled for 7 more days without contacting her doctor. She has 6 that are semi solid, 10 that are powdery and gooey  and 3 that are just gooey. I explained to her that she might could get it before the 7 days but she would have to pay out of pocket because insurance would not pay until the 7 days and she said she would try to make these works. If she can not she will call back.

## 2022-06-14 ENCOUNTER — Ambulatory Visit: Payer: PRIVATE HEALTH INSURANCE | Admitting: Endocrinology

## 2022-07-02 ENCOUNTER — Other Ambulatory Visit: Payer: Self-pay | Admitting: Internal Medicine

## 2022-07-02 DIAGNOSIS — M810 Age-related osteoporosis without current pathological fracture: Secondary | ICD-10-CM

## 2022-07-06 ENCOUNTER — Other Ambulatory Visit: Payer: PRIVATE HEALTH INSURANCE

## 2022-07-06 ENCOUNTER — Other Ambulatory Visit: Payer: 59

## 2022-07-30 ENCOUNTER — Other Ambulatory Visit: Payer: Self-pay | Admitting: Internal Medicine

## 2022-09-28 ENCOUNTER — Other Ambulatory Visit: Payer: 59

## 2022-10-01 ENCOUNTER — Other Ambulatory Visit: Payer: 59

## 2022-10-01 DIAGNOSIS — R5383 Other fatigue: Secondary | ICD-10-CM

## 2022-10-01 DIAGNOSIS — E7800 Pure hypercholesterolemia, unspecified: Secondary | ICD-10-CM

## 2022-10-01 DIAGNOSIS — R7989 Other specified abnormal findings of blood chemistry: Secondary | ICD-10-CM

## 2022-10-01 DIAGNOSIS — R7302 Impaired glucose tolerance (oral): Secondary | ICD-10-CM

## 2022-10-01 DIAGNOSIS — I10 Essential (primary) hypertension: Secondary | ICD-10-CM

## 2022-10-02 LAB — CBC WITH DIFFERENTIAL/PLATELET
Absolute Monocytes: 537 cells/uL (ref 200–950)
Basophils Absolute: 7 cells/uL (ref 0–200)
Basophils Relative: 0.1 %
Eosinophils Absolute: 102 cells/uL (ref 15–500)
Eosinophils Relative: 1.5 %
HCT: 43.4 % (ref 35.0–45.0)
Hemoglobin: 14.3 g/dL (ref 11.7–15.5)
Lymphs Abs: 1952 cells/uL (ref 850–3900)
MCH: 28.3 pg (ref 27.0–33.0)
MCHC: 32.9 g/dL (ref 32.0–36.0)
MCV: 85.9 fL (ref 80.0–100.0)
MPV: 10.1 fL (ref 7.5–12.5)
Monocytes Relative: 7.9 %
Neutro Abs: 4202 cells/uL (ref 1500–7800)
Neutrophils Relative %: 61.8 %
Platelets: 231 10*3/uL (ref 140–400)
RBC: 5.05 10*6/uL (ref 3.80–5.10)
RDW: 12.8 % (ref 11.0–15.0)
Total Lymphocyte: 28.7 %
WBC: 6.8 10*3/uL (ref 3.8–10.8)

## 2022-10-02 LAB — COMPLETE METABOLIC PANEL WITH GFR
AG Ratio: 1.6 (calc) (ref 1.0–2.5)
ALT: 53 U/L — ABNORMAL HIGH (ref 6–29)
AST: 23 U/L (ref 10–35)
Albumin: 4.7 g/dL (ref 3.6–5.1)
Alkaline phosphatase (APISO): 105 U/L (ref 37–153)
BUN: 17 mg/dL (ref 7–25)
CO2: 31 mmol/L (ref 20–32)
Calcium: 10.1 mg/dL (ref 8.6–10.4)
Chloride: 105 mmol/L (ref 98–110)
Creat: 0.73 mg/dL (ref 0.50–1.05)
Globulin: 3 g/dL (calc) (ref 1.9–3.7)
Glucose, Bld: 112 mg/dL — ABNORMAL HIGH (ref 65–99)
Potassium: 5.4 mmol/L — ABNORMAL HIGH (ref 3.5–5.3)
Sodium: 143 mmol/L (ref 135–146)
Total Bilirubin: 0.4 mg/dL (ref 0.2–1.2)
Total Protein: 7.7 g/dL (ref 6.1–8.1)
eGFR: 92 mL/min/{1.73_m2} (ref >=60)

## 2022-10-02 LAB — HEMOGLOBIN A1C
Hgb A1c MFr Bld: 6.1 % of total Hgb — ABNORMAL HIGH (ref <5.7)
Mean Plasma Glucose: 128 mg/dL
eAG (mmol/L): 7.1 mmol/L

## 2022-10-02 LAB — LIPID PANEL
Cholesterol: 229 mg/dL — ABNORMAL HIGH (ref <200)
HDL: 83 mg/dL (ref >=50)
LDL Cholesterol (Calc): 131 mg/dL (calc) — ABNORMAL HIGH
Non-HDL Cholesterol (Calc): 146 mg/dL (calc) — ABNORMAL HIGH (ref <130)
Total CHOL/HDL Ratio: 2.8 (calc) (ref <5.0)
Triglycerides: 61 mg/dL (ref <150)

## 2022-10-02 LAB — TSH: TSH: 1.49 mIU/L (ref 0.40–4.50)

## 2022-10-04 ENCOUNTER — Other Ambulatory Visit (HOSPITAL_COMMUNITY)
Admission: RE | Admit: 2022-10-04 | Discharge: 2022-10-04 | Disposition: A | Discharge status: Home / Self Care | Payer: 59 | Source: Ambulatory Visit | Attending: Internal Medicine | Admitting: Internal Medicine

## 2022-10-04 ENCOUNTER — Ambulatory Visit: Payer: Managed Care, Other (non HMO) | Admitting: Internal Medicine

## 2022-10-04 VITALS — BP 136/84 | HR 85 | Temp 98.2°F | Ht 64.5 in | Wt 162.8 lb

## 2022-10-04 DIAGNOSIS — F419 Anxiety disorder, unspecified: Secondary | ICD-10-CM

## 2022-10-04 DIAGNOSIS — I10 Essential (primary) hypertension: Secondary | ICD-10-CM

## 2022-10-04 DIAGNOSIS — Z0000 Encounter for general adult medical examination without abnormal findings: Secondary | ICD-10-CM

## 2022-10-04 DIAGNOSIS — R7302 Impaired glucose tolerance (oral): Secondary | ICD-10-CM

## 2022-10-04 DIAGNOSIS — E7800 Pure hypercholesterolemia, unspecified: Secondary | ICD-10-CM | POA: Diagnosis not present

## 2022-10-04 DIAGNOSIS — R0683 Snoring: Secondary | ICD-10-CM

## 2022-10-04 DIAGNOSIS — M8580 Other specified disorders of bone density and structure, unspecified site: Secondary | ICD-10-CM

## 2022-10-04 DIAGNOSIS — F32A Depression, unspecified: Secondary | ICD-10-CM

## 2022-10-04 LAB — POCT URINALYSIS DIPSTICK
Bilirubin, UA: NEGATIVE
Blood, UA: NEGATIVE
Glucose, UA: NEGATIVE
Ketones, UA: NEGATIVE
Leukocytes, UA: NEGATIVE
Nitrite, UA: NEGATIVE
Protein, UA: NEGATIVE
Spec Grav, UA: 1.015 (ref 1.010–1.025)
Urobilinogen, UA: 0.2 E.U./dL
pH, UA: 7 (ref 5.0–8.0)

## 2022-10-04 MED ORDER — METFORMIN HCL 500 MG PO TABS: 500.0000 mg | ORAL_TABLET | Freq: Every day | ORAL | 1 refills | Status: DC

## 2022-10-04 NOTE — Progress Notes (Signed)
Subjective:    Patient ID: Nicole Bowers, female    DOB: [birth year], 67 y.o.   MRN: [id number]  HPI 67 year old Female seen for health maintenance exam and evaluation of medical issues. Not following a low fat diet.   Had celebration of life for son who passed away during the pandemic  in [place] recently.  Patient finally has some peace.   Seen at [facility] for osteopenia.  In 2019 unable to adhere to schedule taking Boniva.  She has had several bony fractures toe in 1988, knows all traumatic.  No history of alcoholism, cancer, renal disease, alcoholism, liver disease or gastric bypass surgery.  Does have history of smoking intermittently for many years.  Saw Dr. Loanne Drilling in 2022 who recommended Reclast.    Had Reclast injection October 2022 and seen by Dr. Loanne Drilling in April 2023.  Dr. Dellis Filbert is GYN physician.  Bone density study in April 2023 showed T-score in the LS spine of -0.5.Marland Kitchen  Bone density study in July 2021 showed T-score -2.0.  She has a history of impaired glucose tolerance, history of chronic back pain with history of disc herniations x 3 in neck and lumbar spine.  Had nerve ablation at [facility] in [facility].  History of L4-L5 spondylolisthesis  History of hypertension treated with amlodipine and ramipril.  History of anxiety and depression treated with Xanax and Zoloft.  Hemoglobin A1c has increased to 6.1%.  Starting patient on metformin.  She wants a low dose so we will use 500 mg daily with follow-up in May.  Mild elevation of SGPT which has been followed.  SGPT is now 66 and was 45 in July 2023 had ultrasound of abdomen for evaluation of elevated liver functions and January 2023 and no focal lesions were noted.  Patient has not been following a low-fat diet she says.  Review of Systems no new complaints     Objective:   Physical Exam Blood pressure 136/84, pulse 85, temperature 98.2 degrees pulse oximetry 98% weight 162 pounds 12.8 ounces BMI  27.51  Skin: Warm and dry.  No cervical adenopathy, thyromegaly or carotid bruits.  TMs clear.  Neck supple.  Chest clear.  Breast without masses.  Cardiac exam: Regular rate and rhythm.  Abdomen soft nondistended without hepatosplenomegaly masses or tenderness.  Pelvic exam: N FEG.  Pap taken.  No masses on bimanual exam.  No lower extremity pitting edema.  Neuro is intact without gross focal deficits.     Assessment & Plan:  Hemoglobin A1c is 6.1% having increased from 5.7% in 2021 and 5.6% in 2022.  Recommend metformin 500 mg daily to start and watching her diet.  She is not much overweight.  BMI is 27.51.  Follow-up in a few months.  Bacterial vaginosis on Pap smear.  Could be treated with Flagyl but she likes to drink alcohol and Flagyl would not mix with alcohol as it would cause nausea.  Could use metronidazole cream in the vagina if she so desires  Potassium 5.4-I think this is a hemolyzed specimen as it has been normal in the past  Anxiety and depression treated with Xanax and sertraline.  Seems to be doing better with grief.  Only taking Xanax at night and does not need twice daily.  Allergic rhinitis treated with Zyrtec and Patanol  Hypertension treated with ramipril 10 mg daily and amlodipine 5 mg daily reaction from loss of her son  Osteopenia-refilled high-dose vitamin D 50,000 units weekly  in July 2023.  Following up with Endocrinology soon.  Has received Reclast per Dr. Everardo All  Snoring-referral to pulmonary to see if she has sleep apnea

## 2022-10-04 NOTE — Patient Instructions (Addendum)
Start Metformin 500 mg daily with supper. Receheck AIC and lipid. Follow up in May. Hold off on lipid now. Call [facility] about bone density treatment. Referral to Pulmonary for snoring- consider sleep apnea and testing. Smoking discussed. Is currently only a light smoker. One pack lasts 2.5 days. Only taking Xanax at night currently and does not need twice daily.  Suggest return in May.

## 2022-10-05 LAB — CYTOLOGY - PAP
Comment: NEGATIVE
Diagnosis: NEGATIVE
High risk HPV: NEGATIVE

## 2022-10-08 ENCOUNTER — Other Ambulatory Visit: Payer: 59

## 2022-10-27 ENCOUNTER — Encounter: Payer: Self-pay | Admitting: Internal Medicine

## 2022-10-30 ENCOUNTER — Other Ambulatory Visit: Payer: Self-pay | Admitting: Internal Medicine

## 2022-11-01 ENCOUNTER — Ambulatory Visit (INDEPENDENT_AMBULATORY_CARE_PROVIDER_SITE_OTHER): Payer: 59 | Admitting: Nurse Practitioner

## 2022-11-01 ENCOUNTER — Encounter: Payer: Self-pay | Admitting: Nurse Practitioner

## 2022-11-01 VITALS — BP 126/74 | HR 79 | Ht 64.0 in | Wt 156.0 lb

## 2022-11-01 DIAGNOSIS — F418 Other specified anxiety disorders: Secondary | ICD-10-CM | POA: Diagnosis not present

## 2022-11-01 DIAGNOSIS — R0683 Snoring: Secondary | ICD-10-CM | POA: Insufficient documentation

## 2022-11-01 DIAGNOSIS — G4719 Other hypersomnia: Secondary | ICD-10-CM | POA: Diagnosis not present

## 2022-11-01 NOTE — Progress Notes (Signed)
@Patient  ID: Nicole Bowers, female    DOB: 06/11/1956, 67 y.o.   MRN: [id number]  Chief Complaint  Patient presents with   Consult    Pt sleep consult she has been told she snores, denies having any sleep studies    Referring provider: Elby Showers, MD  HPI: 66 year old female, active smoker referred for sleep consult.  Past medical history significant for hypertension COVID chronic sinusitis, anxiety, depression.  TEST/EVENTS:   11/01/2022: Today - sleep consult Patient presents today for sleep consult, referred by Dr. Renold Genta.  She has had trouble with snoring for many years now.  She also has some restless sleep at night and has felt more fatigued during the day.  She did attribute this to the passing of her son around a year ago.  She is working on grief counseling, which has helped tremendously.  However, she continues to feel tired at times during the day.  She has had trouble with her sleep for many years now, even prior this.  She has tried Ambien in the past, which she did not have a good response to.  She woke up around 5 AM after taking it was wide-awake for a little while and then felt so groggy that she could have fallen asleep at work.  She is currently taking Xanax and Benadryl at bedtime, which seem to help her get to sleep and stay asleep.  She denies any morning headaches, witnessed apneas, drowsy driving, sleep parasomnia/paralysis.  No history of narcolepsy or cataplexy.  She goes to bed between 830 to 9 PM.  Falls asleep in about half an hour.  Wakes maybe once a night.  Officially gets up around 6 AM.  She does drink 3 cups of coffee in the morning but nothing past noon.  She has never had a previous sleep study.  She is down 13 pounds with diet changes. 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  She smokes a half a pack a day.  She drinks 1 glass of wine a few days a week.  Lives at home  with her husband.  Works as a Air traffic controller.  Family history of allergies and cancer.  Epworth 10   Allergies  Allergen Reactions   Demerol Other (See Comments)    Causes blackouts   Codeine     REACTION: nausea, vomiting   Effexor [Venlafaxine Hydrochloride]    Sulfonamide Derivatives     REACTION: hives, itching   Celexa [Citalopram Hydrobromide] Anxiety    Immunization History  Administered Date(s) Administered   PFIZER(Purple Top)SARS-COV-2 Vaccination 12/11/2019, 01/01/2020, 08/22/2020   Tdap 01/30/2010    Past Medical History:  Diagnosis Date   Anxiety    Hypertension    Migraine headache     Tobacco History: Social History   Tobacco Use  Smoking Status Every Day   Packs/day: 0.50   Types: Cigarettes   Start date: 01/14/1970  Smokeless Tobacco Never  Tobacco Comments   Stopped in 1983 for 10 years started again in '93, then stopped again in 2016  started back in 2019   Ready to quit: Not Answered Counseling given: Not Answered Tobacco comments: Stopped in 1983 for 10 years started again in '93, then stopped again in 2016  started back in 2019   Outpatient Medications Prior to Visit  Medication Sig Dispense Refill   ALPRAZolam (XANAX) 0.5 MG tablet 1/2 TO 1 TABLET BY MOUTH IN  AM AND 1 TAB AT BEDTIME (Patient taking differently: 1 TABLET BY MOUTH IN AM AND 1 TAB AT BEDTIME) 60 tablet 5   amLODipine (NORVASC) 5 MG tablet TAKE 1 TABLET BY MOUTH EVERY DAY 90 tablet 3   cetirizine (ZYRTEC) 10 MG tablet Take 10 mg by mouth daily.     diphenhydrAMINE (BENADRYL) 25 mg capsule Take 25 mg by mouth at bedtime.     metFORMIN (GLUCOPHAGE) 500 MG tablet Take 1 tablet (500 mg total) by mouth daily. 90 tablet 1   Multiple Vitamins-Minerals (MULTIVITAMIN WITH MINERALS) tablet Take 1 tablet by mouth daily.     olopatadine (PATANOL) 0.1 % ophthalmic solution 1 drop daily.     Omega 3-6-9 Fatty Acids (OMEGA-3-6-9 PO) Take by mouth.     Propylene Glycol (SYSTANE COMPLETE OP)  Apply to eye in the morning and at bedtime.     ramipril (ALTACE) 10 MG capsule TAKE 1 CAPSULE BY MOUTH EVERY DAY 90 capsule 1   SALINE NASAL SPRAY NA Place into the nose.     sertraline (ZOLOFT) 100 MG tablet TAKE 1 TABLET BY MOUTH EVERY DAY 90 tablet 3   Vitamin D, Ergocalciferol, (DRISDOL) 1.25 MG (50000 UNIT) CAPS capsule TAKE 1 CAPSULE BY MOUTH ONE TIME PER WEEK 12 capsule 3   No facility-administered medications prior to visit.     Review of Systems:   Constitutional: No weight loss or gain, night sweats, fevers, chills, or lassitude. +daytime fatigue  HEENT: No headaches, difficulty swallowing, tooth/dental problems, or sore throat. No sneezing, itching, ear ache. +nasal congestion (chronic) CV:  No chest pain, orthopnea, PND, swelling in lower extremities, anasarca, dizziness, palpitations, syncope Resp: +snoring; occasional cough (baseline). No shortness of breath with exertion or at rest. No excess mucus or change in color of mucus.No hemoptysis. No wheezing.  No chest wall deformity GI:  No heartburn, indigestion, abdominal pain, nausea, vomiting, diarrhea, change in bowel habits, loss of appetite, bloody stools.  GU: No dysuria, change in color of urine, urgency or frequency. Skin: No rash, lesions, ulcerations MSK:  No joint pain or swelling.  +joint stiffness  Neuro: No dizziness or lightheadedness.  Psych: +depression/anxiety. No SI/HI. Mood stable.     Physical Exam:  BP 126/74   Pulse 79   Ht 5\' 4"  (1.626 m)   Wt 156 lb (70.8 kg)   LMP 07/23/2014 (Approximate)   SpO2 99%   BMI 26.78 kg/m   GEN: Pleasant, interactive, well-appearing; in no acute distress HEENT:  Normocephalic and atraumatic. PERRLA. Sclera white. Nasal turbinates pink, moist and patent bilaterally. Deviated septum. No rhinorrhea present. Oropharynx pink and moist, without exudate or edema. No lesions, ulcerations, or postnasal drip. Mallampati II NECK:  Supple w/ fair ROM. No JVD present. Normal  carotid impulses w/o bruits. Thyroid symmetrical with no goiter or nodules palpated. No lymphadenopathy.   CV: RRR, no m/r/g, no peripheral edema. Pulses intact, +2 bilaterally. No cyanosis, pallor or clubbing. PULMONARY:  Unlabored, regular breathing. Clear bilaterally A&P w/o wheezes/rales/rhonchi. No accessory muscle use.  GI: BS present and normoactive. Soft, non-tender to palpation. No organomegaly or masses detected.  MSK: No erythema, warmth or tenderness. Cap refil <2 sec all extrem. No deformities or joint swelling noted.  Neuro: A/Ox3. No focal deficits noted.   Skin: Warm, no lesions or rashe Psych: Normal affect and behavior. Judgement and thought content appropriate.     Lab Results:  CBC    Component Value Date/Time   WBC 6.8 10/01/2022 0954  RBC 5.05 10/01/2022 0954   HGB 14.3 10/01/2022 0954   HCT 43.4 10/01/2022 0954   PLT 231 10/01/2022 0954   MCV 85.9 10/01/2022 0954   MCH 28.3 10/01/2022 0954   MCHC 32.9 10/01/2022 0954   RDW 12.8 10/01/2022 0954   LYMPHSABS 1,952 10/01/2022 0954   MONOABS 496 04/08/2017 1112   EOSABS 102 10/01/2022 0954   BASOSABS 7 10/01/2022 0954    BMET    Component Value Date/Time   NA 143 10/01/2022 0954   K 5.4 (H) 10/01/2022 0954   CL 105 10/01/2022 0954   CO2 31 10/01/2022 0954   GLUCOSE 112 (H) 10/01/2022 0954   BUN 17 10/01/2022 0954   CREATININE 0.73 10/01/2022 0954   CALCIUM 10.1 10/01/2022 0954   GFRNONAA 81 06/06/2020 0921   GFRAA 94 06/06/2020 0921    BNP No results found for: "BNP"   Imaging:  No results found.        No data to display          No results found for: "NITRICOXIDE"      Assessment & Plan:   Snoring She has snoring, excessive daytime sleepiness, restless sleep. Epworth 10. Possible her snoring is secondary to her deviated septum/previous facial trauma. She also has dealt with significant life stressors which could impact her sleep quality. However, this seems to be a  longstanding issue for her and not just one that has developed in the last year. Given this,  I am concerned she could have sleep disordered breathing with obstructive sleep apnea. She will need sleep study for further evaluation.    - discussed how weight can impact sleep and risk for sleep disordered breathing - discussed options to assist with weight loss: combination of diet modification, cardiovascular and strength training exercises   - had an extensive discussion regarding the adverse health consequences related to untreated sleep disordered breathing - specifically discussed the risks for hypertension, coronary artery disease, cardiac dysrhythmias, cerebrovascular disease, and diabetes - lifestyle modification discussed   - discussed how sleep disruption can increase risk of accidents, particularly when driving - safe driving practices were discussed  Patient Instructions  Given your symptoms, I am concerned that you may have sleep disordered breathing with sleep apnea. You will need a sleep study for further evaluation. Someone will contact you to schedule this in the next 10-12 weeks.    We discussed how untreated sleep apnea puts an individual at risk for cardiac arrhthymias, pulm HTN, DM, stroke and increases their risk for daytime accidents. We also briefly reviewed treatment options including weight loss, side sleeping position, oral appliance, CPAP therapy or referral to ENT for possible surgical options   Use caution when driving and pull over if you become sleepy   Follow up in 12-14 weeks with Florentina Addison Correna Meacham,NP after your sleep study to review results, or sooner, if needed   Excessive daytime sleepiness See above  Depression with anxiety Grief response. She is working with a Camera operator. Appears to be coping effectively. Encouraged her to continue focusing on her mental health. She does understand the correlation between anxiety/depression and fatigue/restless sleep. See  above plan regarding sleep.   I spent 35 minutes of dedicated to the care of this patient on the date of this encounter to include pre-visit review of records, face-to-face time with the patient discussing conditions above, post visit ordering of testing, clinical documentation with the electronic health record, making appropriate referrals as documented, and communicating necessary findings to  members of the patients care team.  Clayton Bibles, NP 11/01/2022  Pt aware and understands NP's role.

## 2022-11-01 NOTE — Assessment & Plan Note (Signed)
She has snoring, excessive daytime sleepiness, restless sleep. Epworth 10. Possible her snoring is secondary to her deviated septum/previous facial trauma. She also has dealt with significant life stressors which could impact her sleep quality. However, this seems to be a longstanding issue for her and not just one that has developed in the last year. Given this,  I am concerned she could have sleep disordered breathing with obstructive sleep apnea. She will need sleep study for further evaluation.    - discussed how weight can impact sleep and risk for sleep disordered breathing - discussed options to assist with weight loss: combination of diet modification, cardiovascular and strength training exercises   - had an extensive discussion regarding the adverse health consequences related to untreated sleep disordered breathing - specifically discussed the risks for hypertension, coronary artery disease, cardiac dysrhythmias, cerebrovascular disease, and diabetes - lifestyle modification discussed   - discussed how sleep disruption can increase risk of accidents, particularly when driving - safe driving practices were discussed  Patient Instructions  Given your symptoms, I am concerned that you may have sleep disordered breathing with sleep apnea. You will need a sleep study for further evaluation. Someone will contact you to schedule this in the next 10-12 weeks.    We discussed how untreated sleep apnea puts an individual at risk for cardiac arrhthymias, pulm HTN, DM, stroke and increases their risk for daytime accidents. We also briefly reviewed treatment options including weight loss, side sleeping position, oral appliance, CPAP therapy or referral to ENT for possible surgical options   Use caution when driving and pull over if you become sleepy   Follow up in 12-14 weeks with Joellen Jersey Caitlen Worth,NP after your sleep study to review results, or sooner, if needed

## 2022-11-01 NOTE — Assessment & Plan Note (Signed)
See above

## 2022-11-01 NOTE — Assessment & Plan Note (Signed)
Grief response. She is working with a Barrister's clerk. Appears to be coping effectively. Encouraged her to continue focusing on her mental health. She does understand the correlation between anxiety/depression and fatigue/restless sleep. See above plan regarding sleep.

## 2022-11-01 NOTE — Patient Instructions (Signed)
Given your symptoms, I am concerned that you may have sleep disordered breathing with sleep apnea. You will need a sleep study for further evaluation. Someone will contact you to schedule this in the next 10-12 weeks.    We discussed how untreated sleep apnea puts an individual at risk for cardiac arrhthymias, pulm HTN, DM, stroke and increases their risk for daytime accidents. We also briefly reviewed treatment options including weight loss, side sleeping position, oral appliance, CPAP therapy or referral to ENT for possible surgical options   Use caution when driving and pull over if you become sleepy   Follow up in 12-14 weeks with Nicole Jersey Mattelyn Imhoff,NP after your sleep study to review results, or sooner, if needed

## 2022-11-22 ENCOUNTER — Ambulatory Visit
Admission: RE | Admit: 2022-11-22 | Discharge: 2022-11-22 | Disposition: A | Discharge status: Home / Self Care | Payer: 59 | Source: Ambulatory Visit | Attending: Internal Medicine | Admitting: Internal Medicine

## 2022-11-22 DIAGNOSIS — N6320 Unspecified lump in the left breast, unspecified quadrant: Secondary | ICD-10-CM

## 2022-12-15 ENCOUNTER — Other Ambulatory Visit: Payer: Self-pay | Admitting: Internal Medicine
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
## 2023-02-01 ENCOUNTER — Other Ambulatory Visit: Payer: Self-pay | Admitting: Internal Medicine

## 2023-02-04 ENCOUNTER — Other Ambulatory Visit: Payer: 59

## 2023-02-04 DIAGNOSIS — R7302 Impaired glucose tolerance (oral): Secondary | ICD-10-CM

## 2023-02-04 DIAGNOSIS — E7800 Pure hypercholesterolemia, unspecified: Secondary | ICD-10-CM

## 2023-02-05 LAB — HEMOGLOBIN A1C
Hgb A1c MFr Bld: 6.1 % of total Hgb — ABNORMAL HIGH (ref <5.7)
Mean Plasma Glucose: 128 mg/dL
eAG (mmol/L): 7.1 mmol/L

## 2023-02-05 LAB — LIPID PANEL
Cholesterol: 222 mg/dL — ABNORMAL HIGH (ref <200)
HDL: 87 mg/dL (ref >=50)
LDL Cholesterol (Calc): 121 mg/dL (calc) — ABNORMAL HIGH
Non-HDL Cholesterol (Calc): 135 mg/dL (calc) — ABNORMAL HIGH (ref <130)
Total CHOL/HDL Ratio: 2.6 (calc) (ref <5.0)
Triglycerides: 51 mg/dL (ref <150)

## 2023-02-07 ENCOUNTER — Ambulatory Visit: Payer: 59 | Admitting: Internal Medicine

## 2023-02-07 ENCOUNTER — Encounter: Payer: Self-pay | Admitting: Internal Medicine

## 2023-02-07 VITALS — BP 118/82 | HR 74 | Temp 97.1°F | Resp 16 | Ht 64.0 in | Wt 146.8 lb

## 2023-02-07 DIAGNOSIS — E7800 Pure hypercholesterolemia, unspecified: Secondary | ICD-10-CM

## 2023-02-07 DIAGNOSIS — M8580 Other specified disorders of bone density and structure, unspecified site: Secondary | ICD-10-CM | POA: Diagnosis not present

## 2023-02-07 DIAGNOSIS — R7302 Impaired glucose tolerance (oral): Secondary | ICD-10-CM | POA: Diagnosis not present

## 2023-02-07 DIAGNOSIS — Z23 Encounter for immunization: Secondary | ICD-10-CM | POA: Diagnosis not present

## 2023-02-07 DIAGNOSIS — I10 Essential (primary) hypertension: Secondary | ICD-10-CM | POA: Diagnosis not present

## 2023-02-07 MED ORDER — ROSUVASTATIN CALCIUM 5 MG PO TABS: 5.0000 mg | ORAL_TABLET | Freq: Every day | ORAL | 1 refills | Status: DC

## 2023-02-07 MED ORDER — METFORMIN HCL 500 MG PO TABS: 500.0000 mg | ORAL_TABLET | Freq: Two times a day (BID) | ORAL | 3 refills | Status: DC

## 2023-02-07 NOTE — Patient Instructions (Addendum)
Patient has been seen at [facility] Endocr[facility] and would like to go back. Previously seen by Dr. Everardo All. Follow up here in 3 months with labs and OV. Begin Metformin. Going on Medicare soon. Vaccines discussed.

## 2023-03-24 ENCOUNTER — Other Ambulatory Visit: Payer: Self-pay | Admitting: Internal Medicine

## 2023-05-01 ENCOUNTER — Other Ambulatory Visit: Payer: Self-pay | Admitting: Internal Medicine
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
## 2023-05-13 ENCOUNTER — Other Ambulatory Visit: Payer: 59

## 2023-05-13 DIAGNOSIS — R7302 Impaired glucose tolerance (oral): Secondary | ICD-10-CM

## 2023-05-13 DIAGNOSIS — E7800 Pure hypercholesterolemia, unspecified: Secondary | ICD-10-CM

## 2023-05-14 LAB — HEPATIC FUNCTION PANEL
AG Ratio: 1.9 (calc) (ref 1.0–2.5)
ALT: 32 U/L — ABNORMAL HIGH (ref 6–29)
AST: 24 U/L (ref 10–35)
Albumin: 5 g/dL (ref 3.6–5.1)
Alkaline phosphatase (APISO): 90 U/L (ref 37–153)
Bilirubin, Direct: 0.1 mg/dL (ref 0.0–0.2)
Globulin: 2.6 g/dL (ref 1.9–3.7)
Indirect Bilirubin: 0.2 mg/dL (ref 0.2–1.2)
Total Bilirubin: 0.3 mg/dL (ref 0.2–1.2)
Total Protein: 7.6 g/dL (ref 6.1–8.1)

## 2023-05-14 LAB — LIPID PANEL
Cholesterol: 176 mg/dL (ref <200)
HDL: 87 mg/dL (ref >=50)
LDL Cholesterol (Calc): 76 mg/dL
Non-HDL Cholesterol (Calc): 89 mg/dL (ref <130)
Total CHOL/HDL Ratio: 2 (calc) (ref <5.0)
Triglycerides: 45 mg/dL (ref <150)

## 2023-05-14 LAB — HEMOGLOBIN A1C
Hgb A1c MFr Bld: 5.9 %{Hb} — ABNORMAL HIGH (ref <5.7)
Mean Plasma Glucose: 123 mg/dL
eAG (mmol/L): 6.8 mmol/L

## 2023-05-16 ENCOUNTER — Encounter: Payer: Self-pay | Admitting: Internal Medicine

## 2023-05-16 ENCOUNTER — Ambulatory Visit: Payer: 59 | Admitting: Internal Medicine

## 2023-05-16 VITALS — BP 120/80 | HR 76 | Ht 64.0 in | Wt 145.0 lb

## 2023-05-16 DIAGNOSIS — R7302 Impaired glucose tolerance (oral): Secondary | ICD-10-CM

## 2023-05-16 DIAGNOSIS — F419 Anxiety disorder, unspecified: Secondary | ICD-10-CM

## 2023-05-16 DIAGNOSIS — E7800 Pure hypercholesterolemia, unspecified: Secondary | ICD-10-CM | POA: Diagnosis not present

## 2023-05-16 DIAGNOSIS — Z23 Encounter for immunization: Secondary | ICD-10-CM

## 2023-05-16 DIAGNOSIS — R0683 Snoring: Secondary | ICD-10-CM

## 2023-05-16 DIAGNOSIS — I10 Essential (primary) hypertension: Secondary | ICD-10-CM

## 2023-05-16 DIAGNOSIS — F32A Depression, unspecified: Secondary | ICD-10-CM

## 2023-05-16 NOTE — Patient Instructions (Addendum)
It was a pleasure to see you today. Return in 6 months or as needed.Message has been sent to Pulmonary about sleep apnea follow up. Watch diet and continue same meds. Tetanus vaccine given today. Health maintenance exam scheduled for January 2025.

## 2023-05-20 ENCOUNTER — Ambulatory Visit: Payer: 59 | Admitting: Internal Medicine

## 2023-06-06 LAB — HM DIABETES EYE EXAM

## 2023-06-22 ENCOUNTER — Encounter: Payer: Self-pay | Admitting: Internal Medicine

## 2023-07-28 ENCOUNTER — Other Ambulatory Visit: Payer: Self-pay | Admitting: Internal Medicine

## 2023-08-17 ENCOUNTER — Other Ambulatory Visit: Payer: Self-pay | Admitting: Internal Medicine

## 2023-10-21 ENCOUNTER — Other Ambulatory Visit: Payer: 59

## 2023-10-21 DIAGNOSIS — F32A Depression, unspecified: Secondary | ICD-10-CM

## 2023-10-21 DIAGNOSIS — E785 Hyperlipidemia, unspecified: Secondary | ICD-10-CM

## 2023-10-21 DIAGNOSIS — I10 Essential (primary) hypertension: Secondary | ICD-10-CM

## 2023-10-21 DIAGNOSIS — Z0000 Encounter for general adult medical examination without abnormal findings: Secondary | ICD-10-CM

## 2023-10-21 DIAGNOSIS — R7302 Impaired glucose tolerance (oral): Secondary | ICD-10-CM

## 2023-10-21 DIAGNOSIS — E875 Hyperkalemia: Secondary | ICD-10-CM

## 2023-10-21 DIAGNOSIS — M8580 Other specified disorders of bone density and structure, unspecified site: Secondary | ICD-10-CM

## 2023-10-21 DIAGNOSIS — E7800 Pure hypercholesterolemia, unspecified: Secondary | ICD-10-CM

## 2023-10-22 LAB — CBC WITH DIFFERENTIAL/PLATELET
Absolute Lymphocytes: 2257 {cells}/uL (ref 850–3900)
Absolute Monocytes: 440 {cells}/uL (ref 200–950)
Basophils Absolute: 12 {cells}/uL (ref 0–200)
Basophils Relative: 0.2 %
Eosinophils Absolute: 167 {cells}/uL (ref 15–500)
Eosinophils Relative: 2.7 %
HCT: 45.8 % — ABNORMAL HIGH (ref 35.0–45.0)
Hemoglobin: 14.9 g/dL (ref 11.7–15.5)
MCH: 28.1 pg (ref 27.0–33.0)
MCHC: 32.5 g/dL (ref 32.0–36.0)
MCV: 86.4 fL (ref 80.0–100.0)
MPV: 10.1 fL (ref 7.5–12.5)
Monocytes Relative: 7.1 %
Neutro Abs: 3323 {cells}/uL (ref 1500–7800)
Neutrophils Relative %: 53.6 %
Platelets: 233 10*3/uL (ref 140–400)
RBC: 5.3 10*6/uL — ABNORMAL HIGH (ref 3.80–5.10)
RDW: 12.6 % (ref 11.0–15.0)
Total Lymphocyte: 36.4 %
WBC: 6.2 10*3/uL (ref 3.8–10.8)

## 2023-10-22 LAB — LIPID PANEL
Cholesterol: 181 mg/dL (ref <200)
HDL: 101 mg/dL (ref >=50)
LDL Cholesterol (Calc): 66 mg/dL
Non-HDL Cholesterol (Calc): 80 mg/dL (ref <130)
Total CHOL/HDL Ratio: 1.8 (calc) (ref <5.0)
Triglycerides: 55 mg/dL (ref <150)

## 2023-10-22 LAB — HEMOGLOBIN A1C
Hgb A1c MFr Bld: 6 %{Hb} — ABNORMAL HIGH (ref <5.7)
Mean Plasma Glucose: 126 mg/dL
eAG (mmol/L): 7 mmol/L

## 2023-10-22 LAB — COMPLETE METABOLIC PANEL WITH GFR
AG Ratio: 2 (calc) (ref 1.0–2.5)
ALT: 29 U/L (ref 6–29)
AST: 18 U/L (ref 10–35)
Albumin: 5 g/dL (ref 3.6–5.1)
Alkaline phosphatase (APISO): 77 U/L (ref 37–153)
BUN/Creatinine Ratio: 34 (calc) — ABNORMAL HIGH (ref 6–22)
BUN: 28 mg/dL — ABNORMAL HIGH (ref 7–25)
CO2: 29 mmol/L (ref 20–32)
Calcium: 10.7 mg/dL — ABNORMAL HIGH (ref 8.6–10.4)
Chloride: 103 mmol/L (ref 98–110)
Creat: 0.82 mg/dL (ref 0.50–1.05)
Globulin: 2.5 g/dL (ref 1.9–3.7)
Glucose, Bld: 110 mg/dL — ABNORMAL HIGH (ref 65–99)
Potassium: 6 mmol/L — ABNORMAL HIGH (ref 3.5–5.3)
Sodium: 142 mmol/L (ref 135–146)
Total Bilirubin: 0.3 mg/dL (ref 0.2–1.2)
Total Protein: 7.5 g/dL (ref 6.1–8.1)
eGFR: 79 mL/min/{1.73_m2} (ref >=60)

## 2023-10-22 LAB — TSH: TSH: 1.32 m[IU]/L (ref 0.40–4.50)

## 2023-10-24 ENCOUNTER — Ambulatory Visit: Payer: 59 | Admitting: Internal Medicine

## 2023-10-24 VITALS — BP 120/80 | Ht 64.0 in | Wt 146.0 lb

## 2023-10-24 DIAGNOSIS — Z0000 Encounter for general adult medical examination without abnormal findings: Secondary | ICD-10-CM | POA: Diagnosis not present

## 2023-10-24 DIAGNOSIS — R7302 Impaired glucose tolerance (oral): Secondary | ICD-10-CM

## 2023-10-24 DIAGNOSIS — E875 Hyperkalemia: Secondary | ICD-10-CM

## 2023-10-24 DIAGNOSIS — I10 Essential (primary) hypertension: Secondary | ICD-10-CM

## 2023-10-24 DIAGNOSIS — F419 Anxiety disorder, unspecified: Secondary | ICD-10-CM

## 2023-10-24 DIAGNOSIS — F32A Depression, unspecified: Secondary | ICD-10-CM

## 2023-10-24 DIAGNOSIS — E7800 Pure hypercholesterolemia, unspecified: Secondary | ICD-10-CM
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
## 2023-10-25 LAB — CALCIUM: Calcium: 10.8 mg/dL — ABNORMAL HIGH (ref 8.6–10.4)

## 2023-10-25 LAB — POTASSIUM: Potassium: 5.9 mmol/L — ABNORMAL HIGH (ref 3.5–5.3)

## 2023-10-25 NOTE — Addendum Note (Signed)
Addended by: Gregery Na on: 10/25/2023 12:39 PM   Modules accepted: Orders

## 2023-10-25 NOTE — Patient Instructions (Addendum)
Hgb AIC is 6%. Watch diet. Continue metformin. Need repeat calcium and potassium when well hydrated. These need to be repeated STAT. Also want to do parathyroid hormone assay.

## 2023-10-26 ENCOUNTER — Other Ambulatory Visit: Payer: Self-pay | Admitting: Internal Medicine

## 2023-10-26 ENCOUNTER — Encounter: Payer: Self-pay | Admitting: Internal Medicine

## 2023-10-31 ENCOUNTER — Other Ambulatory Visit: Payer: 59

## 2023-10-31 DIAGNOSIS — R0683 Snoring: Secondary | ICD-10-CM

## 2023-10-31 DIAGNOSIS — G4719 Other hypersomnia: Secondary | ICD-10-CM

## 2023-10-31 DIAGNOSIS — E875 Hyperkalemia: Secondary | ICD-10-CM

## 2023-11-01 LAB — CALCIUM: Calcium: 10.6 mg/dL — ABNORMAL HIGH (ref 8.6–10.4)

## 2023-11-01 LAB — POTASSIUM: Potassium: 5.4 mmol/L — ABNORMAL HIGH (ref 3.5–5.3)

## 2023-11-01 LAB — PTH, INTACT AND CALCIUM
Calcium: 10.7 mg/dL — ABNORMAL HIGH (ref 8.6–10.4)
PTH: 31 pg/mL (ref 16–77)

## 2023-11-02 ENCOUNTER — Other Ambulatory Visit: Payer: Self-pay | Admitting: Internal Medicine

## 2023-11-02 DIAGNOSIS — Z09 Encounter for follow-up examination after completed treatment for conditions other than malignant neoplasm: Secondary | ICD-10-CM

## 2023-11-07 IMAGING — US US BREAST*L* LIMITED INC AXILLA
1 series · 10 of 10 positions shown · non-contrast
Comparison: Previous exam(s).

CLINICAL DATA: Screening recall for bilateral breast asymmetries
and a possible left breast mass.

EXAM:
DIGITAL DIAGNOSTIC BILATERAL MAMMOGRAM WITH TOMOSYNTHESIS AND CAD;
ULTRASOUND LEFT BREAST LIMITED
TECHNIQUE: Bilateral digital diagnostic mammography and breast tomosynthesis
was performed. The images were evaluated with computer-aided
detection.; Targeted ultrasound examination of the left breast was
performed.

[Series 1: us breast*left* limited inc axilla · 0.06mm/px · 10 of 10 slices shown]
[im 1/10]
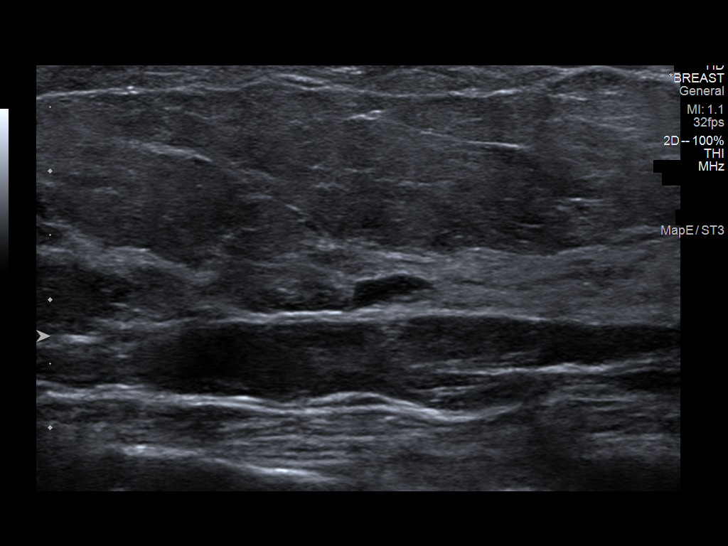
[im 2/10]
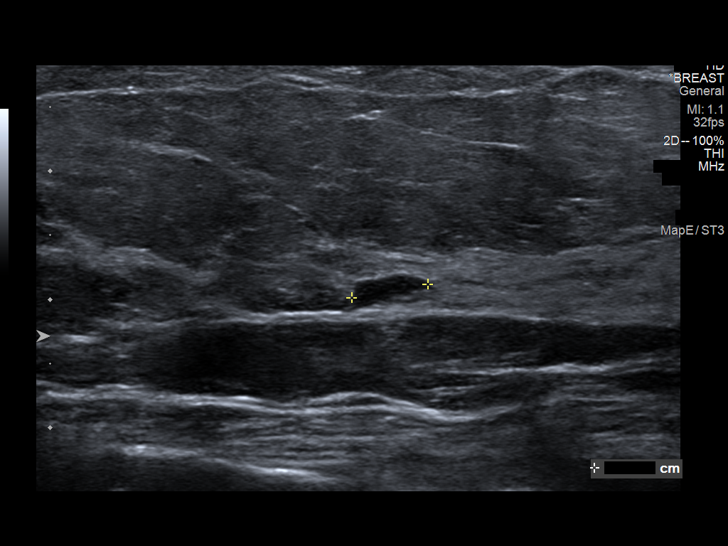
[im 3/10]
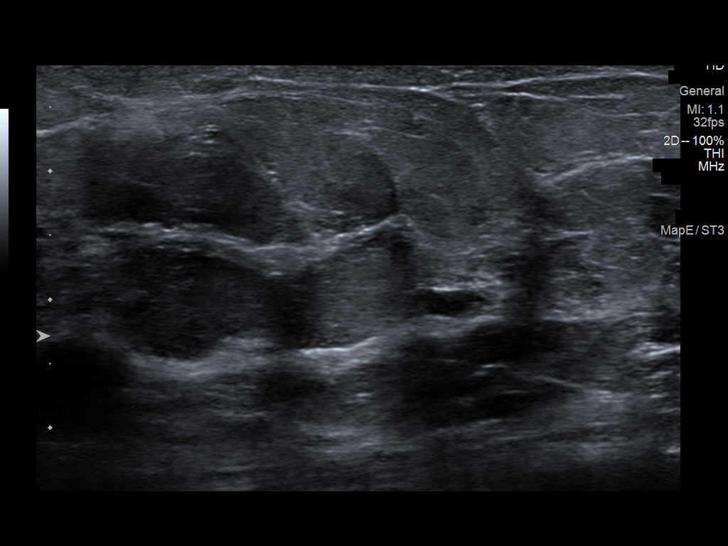
[im 4/10]
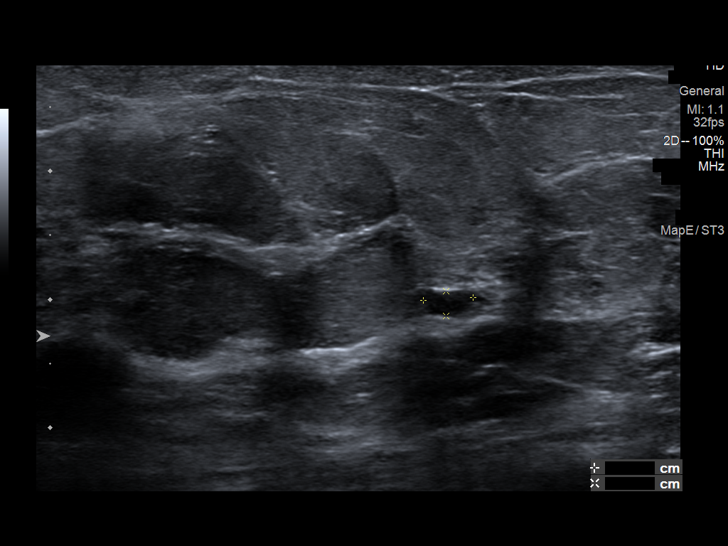
[im 5/10]
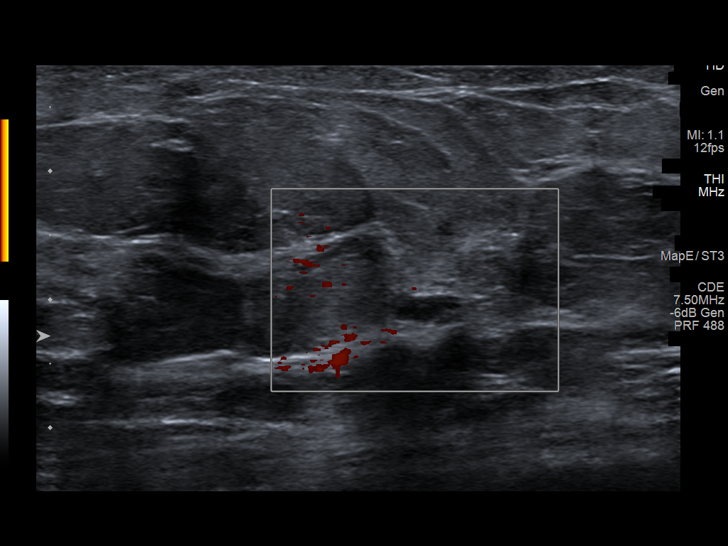
[im 6/10]
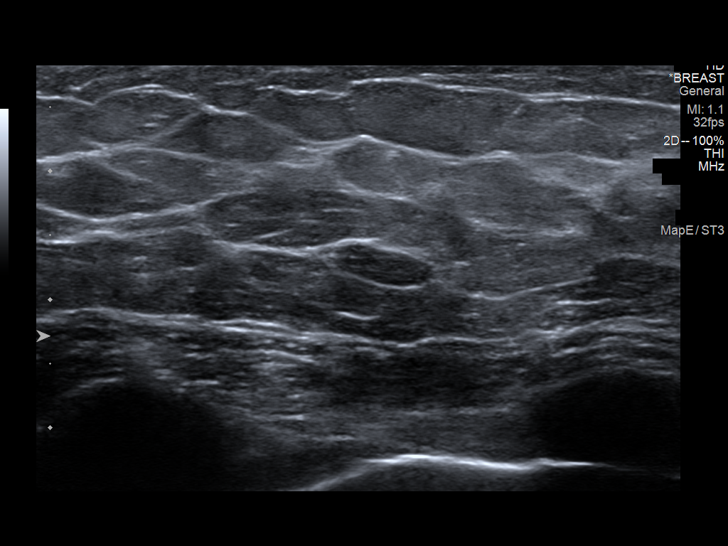
[im 7/10]
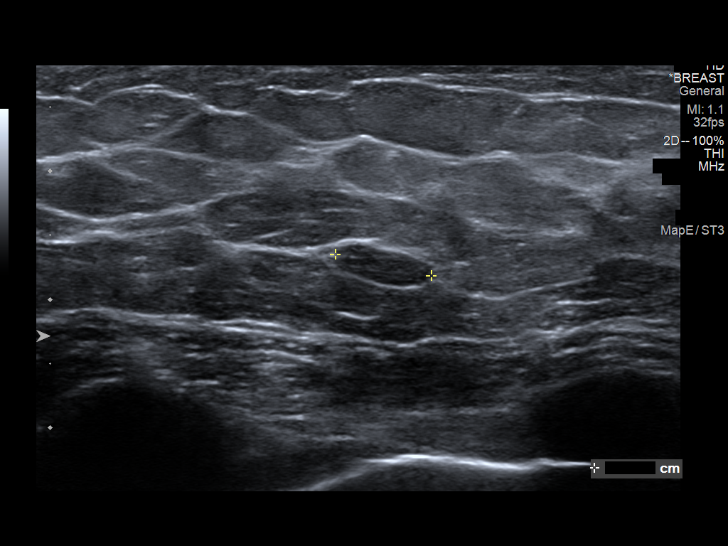
[im 8/10]
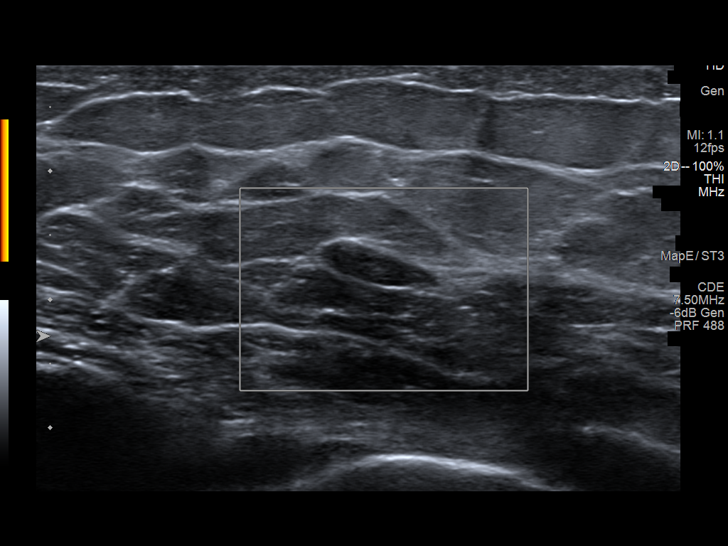
[im 9/10]
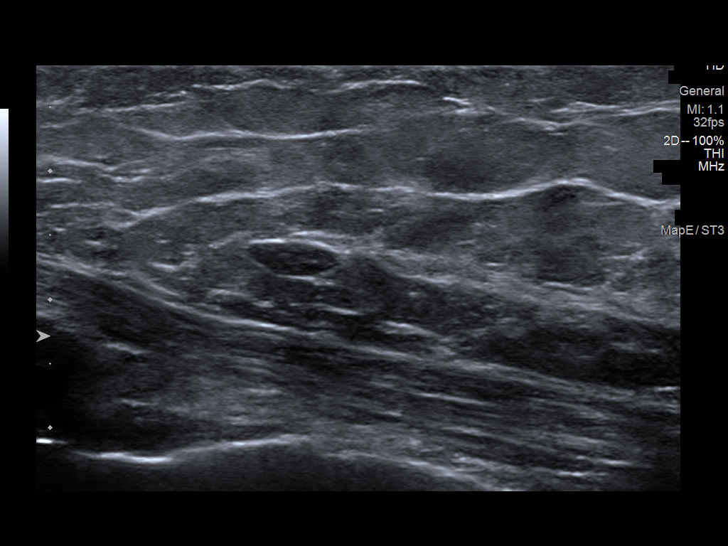
[im 10/10]
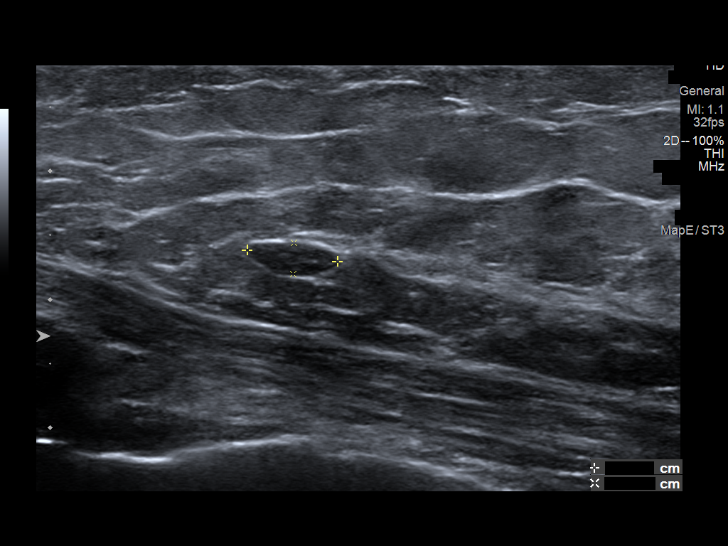

[10 of 10 positions shown; findings below may reference images not displayed]

ACR Breast Density Category c: The breast tissue is heterogeneously
dense, which may obscure small masses.
FINDINGS: Spot compression tomosynthesis images through the medial right
breast demonstrates no persistent suspicious findings. Images of the
medial to upper inner left breast demonstrates an oval circumscribed
mass in the far posterior depth measuring approximately 9 mm. There
is another low-density mass seen on the screening mammogram in the
middle depth of the left breast, however it does not clearly persist
on today's images.

Ultrasound targeted to the left breast at 9 o'clock, 3 cm from the
nipple demonstrates a near anechoic oval circumscribed mass
measuring 6 x 2 x 4 mm.

Ultrasound of the left breast at 10 o'clock, 7 cm from the nipple
demonstrates a hypoechoic oval circumscribed mass measuring 8 x 2 x
7 mm.
IMPRESSION: 1. There are 2 likely benign masses in the left breast at 7 o'clock
and 9 o'clock.

2. The abnormality found on the screening mammogram in the right
breast does not persist on today's images.

RECOMMENDATION:
Six-month follow-up diagnostic left breast mammogram and ultrasound.

I have discussed the findings and recommendations with the patient.
If applicable, a reminder letter will be sent to the patient
regarding the next appointment.

BI-RADS CATEGORY  3: Probably benign.

## 2023-11-07 IMAGING — MG DIGITAL DIAGNOSTIC BILAT W/ TOMO W/ CAD
8 of 14 series · 8 of 40 positions shown · non-contrast
Comparison: Previous exam(s).

CLINICAL DATA: Screening recall for bilateral breast asymmetries
and a possible left breast mass.

EXAM:
DIGITAL DIAGNOSTIC BILATERAL MAMMOGRAM WITH TOMOSYNTHESIS AND CAD;
ULTRASOUND LEFT BREAST LIMITED
TECHNIQUE: Bilateral digital diagnostic mammography and breast tomosynthesis
was performed. The images were evaluated with computer-aided
detection.; Targeted ultrasound examination of the left breast was
performed.

[L CC synth-2D]
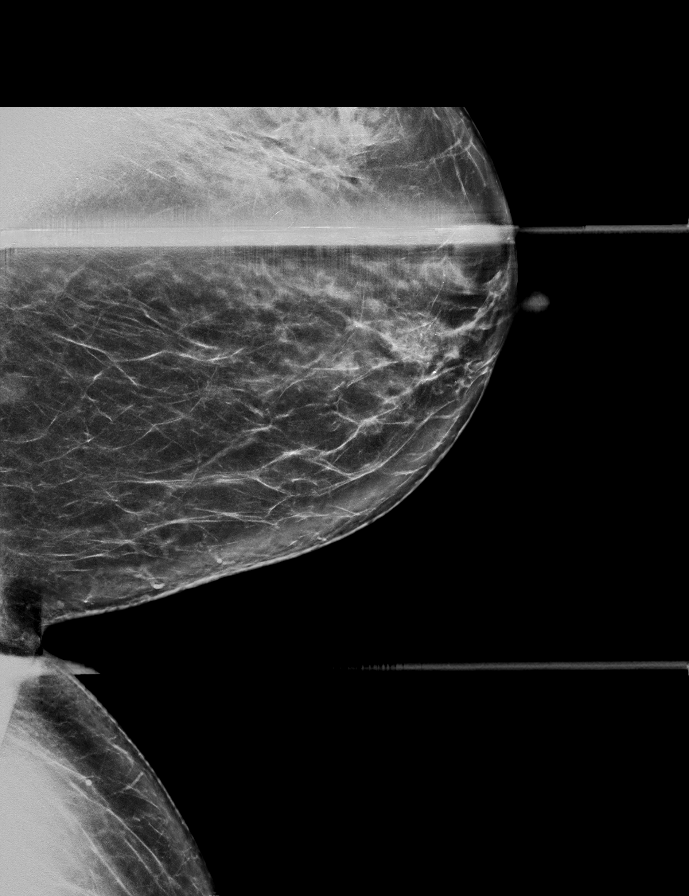

[R CC synth-2D]
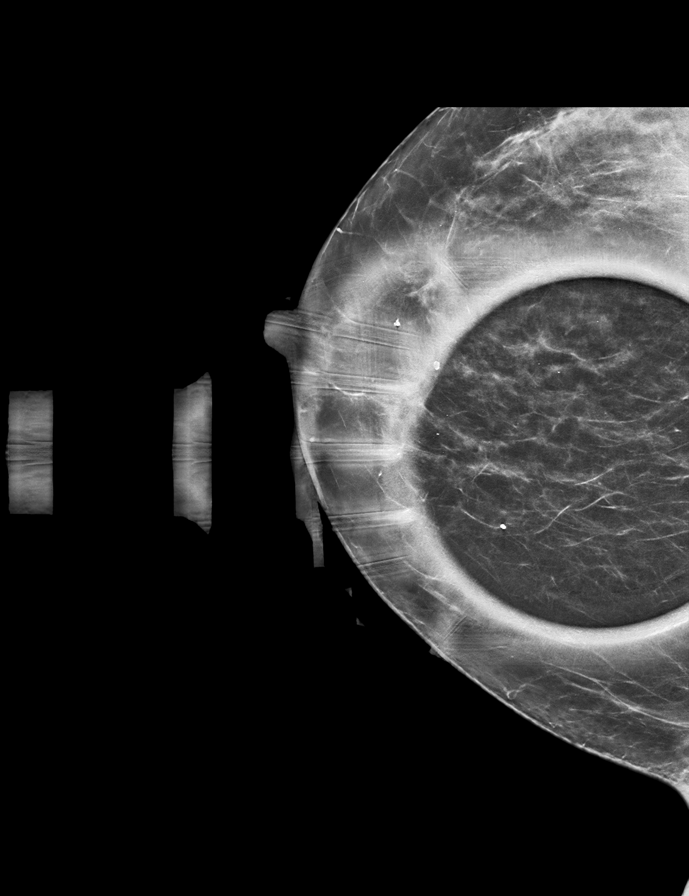

[L MLO synth-2D (1 of 3)]
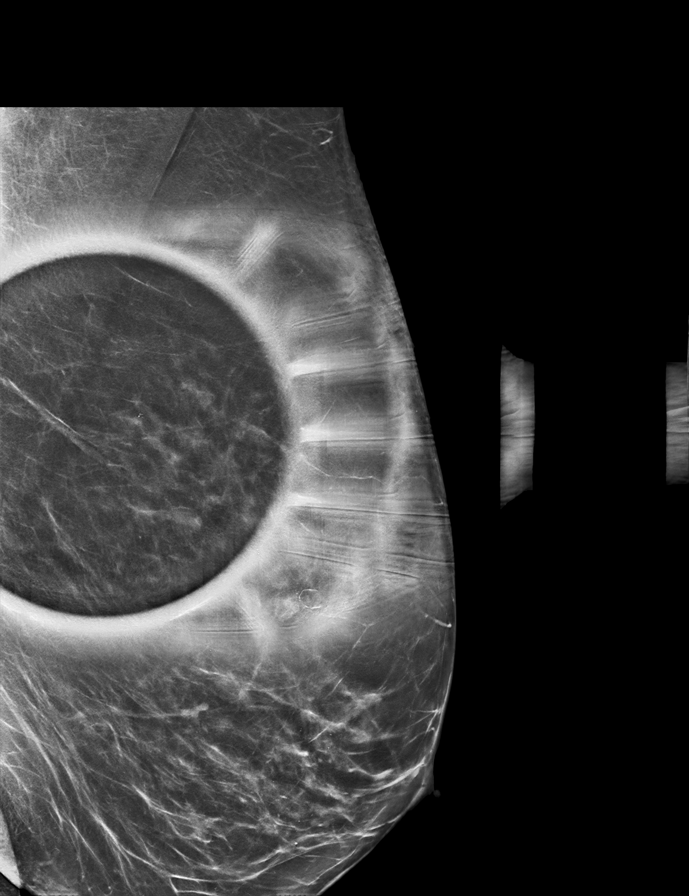

[L MLO synth-2D (2 of 3)]
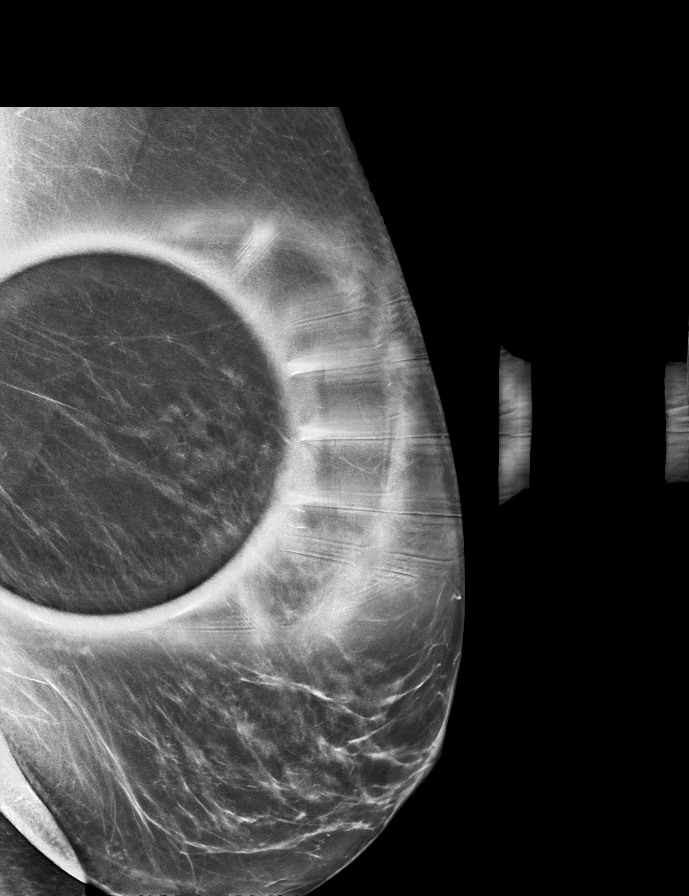

[L MLO synth-2D (3 of 3)]
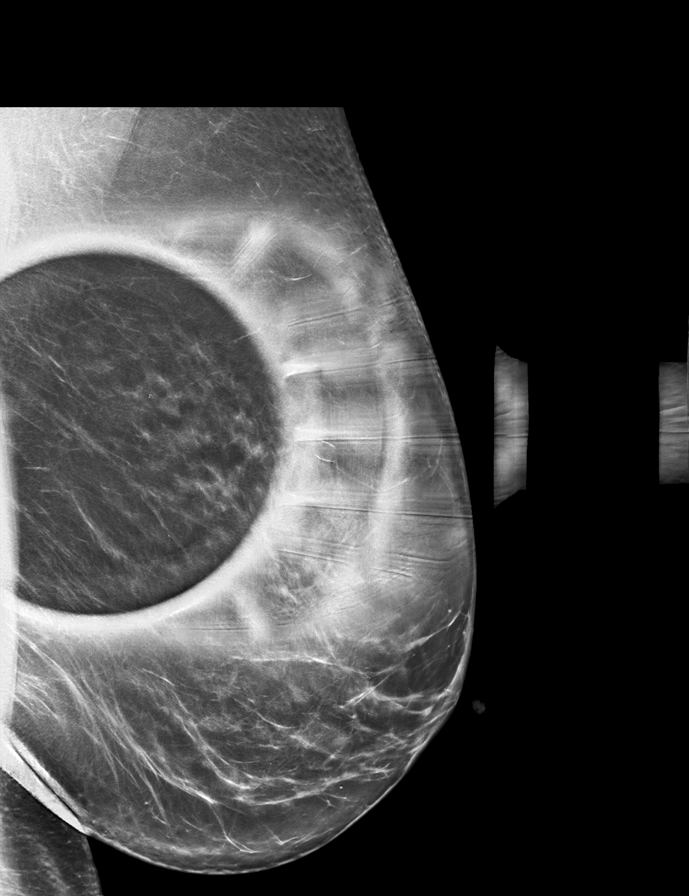

[R ML synth-2D]
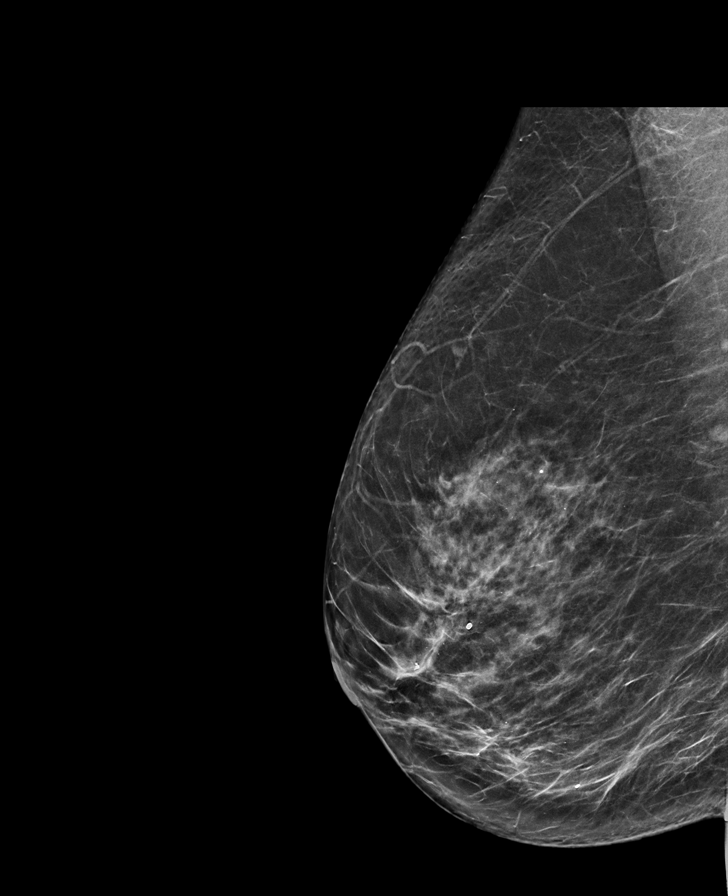

[L ML synth-2D]
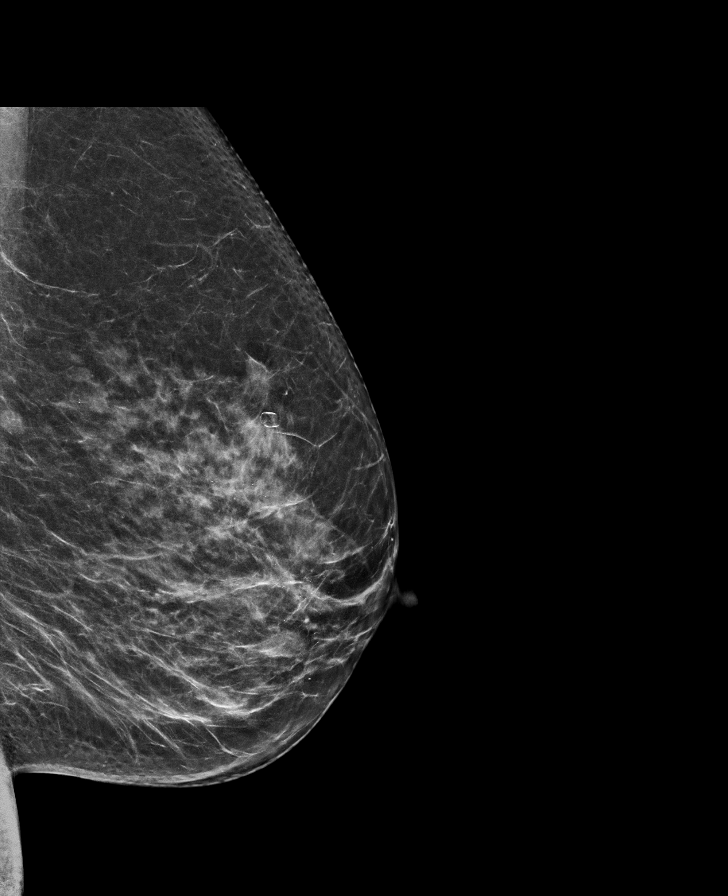

[L MLO tomo · tomo slice 39/77.0]
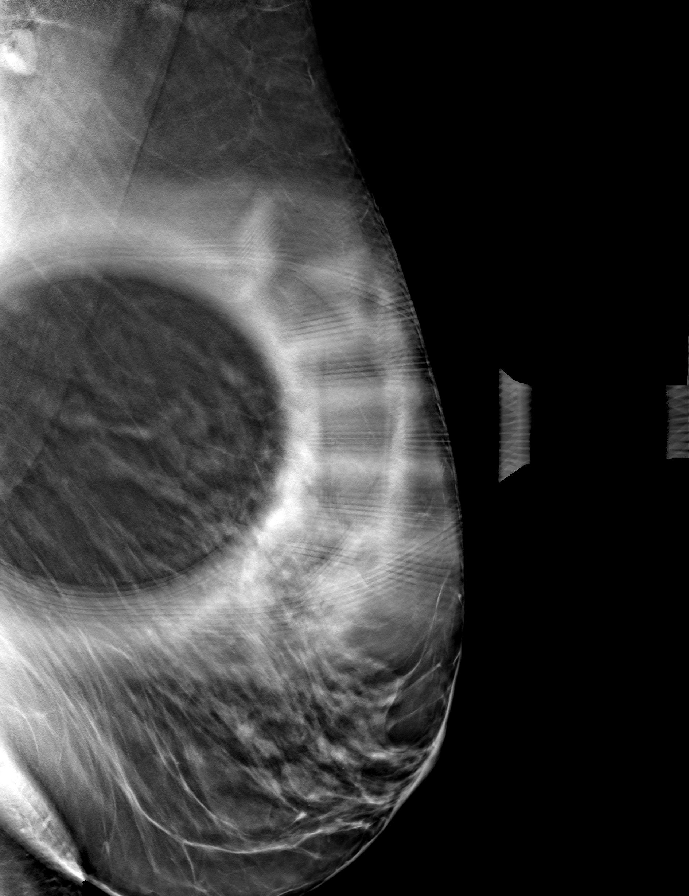

[8 of 40 positions shown; findings below may reference images not displayed]

ACR Breast Density Category c: The breast tissue is heterogeneously
dense, which may obscure small masses.
FINDINGS: Spot compression tomosynthesis images through the medial right
breast demonstrates no persistent suspicious findings. Images of the
medial to upper inner left breast demonstrates an oval circumscribed
mass in the far posterior depth measuring approximately 9 mm. There
is another low-density mass seen on the screening mammogram in the
middle depth of the left breast, however it does not clearly persist
on today's images.

Ultrasound targeted to the left breast at 9 o'clock, 3 cm from the
nipple demonstrates a near anechoic oval circumscribed mass
measuring 6 x 2 x 4 mm.

Ultrasound of the left breast at 10 o'clock, 7 cm from the nipple
demonstrates a hypoechoic oval circumscribed mass measuring 8 x 2 x
7 mm.
IMPRESSION: 1. There are 2 likely benign masses in the left breast at 7 o'clock
and 9 o'clock.

2. The abnormality found on the screening mammogram in the right
breast does not persist on today's images.

RECOMMENDATION:
Six-month follow-up diagnostic left breast mammogram and ultrasound.

I have discussed the findings and recommendations with the patient.
If applicable, a reminder letter will be sent to the patient
regarding the next appointment.

BI-RADS CATEGORY  3: Probably benign.

## 2023-12-07 ENCOUNTER — Other Ambulatory Visit: Payer: Self-pay | Admitting: Internal Medicine

## 2023-12-07 DIAGNOSIS — Z09 Encounter for follow-up examination after completed treatment for conditions other than malignant neoplasm: Secondary | ICD-10-CM

## 2023-12-08 ENCOUNTER — Other Ambulatory Visit: Payer: Self-pay | Admitting: Internal Medicine

## 2023-12-08 DIAGNOSIS — Z09 Encounter for follow-up examination after completed treatment for conditions other than malignant neoplasm: Secondary | ICD-10-CM

## 2023-12-08 DIAGNOSIS — N6320 Unspecified lump in the left breast, unspecified quadrant: Secondary | ICD-10-CM

## 2023-12-08 DIAGNOSIS — R2232 Localized swelling, mass and lump, left upper limb: Secondary | ICD-10-CM

## 2023-12-12 ENCOUNTER — Ambulatory Visit
Admission: RE | Admit: 2023-12-12 | Discharge: 2023-12-12 | Disposition: A | Discharge status: Home / Self Care | Payer: Self-pay | Source: Ambulatory Visit | Attending: Internal Medicine | Admitting: Internal Medicine

## 2023-12-12 DIAGNOSIS — N6320 Unspecified lump in the left breast, unspecified quadrant: Secondary | ICD-10-CM

## 2023-12-18 ENCOUNTER — Encounter: Payer: Self-pay | Admitting: Nurse Practitioner

## 2024-01-25 ENCOUNTER — Other Ambulatory Visit: Payer: Self-pay | Admitting: Internal Medicine

## 2024-01-25 ENCOUNTER — Other Ambulatory Visit: Payer: Self-pay

## 2024-01-25 DIAGNOSIS — E785 Hyperlipidemia, unspecified: Secondary | ICD-10-CM

## 2024-01-25 DIAGNOSIS — R7302 Impaired glucose tolerance (oral): Secondary | ICD-10-CM

## 2024-01-25 DIAGNOSIS — I10 Essential (primary) hypertension: Secondary | ICD-10-CM

## 2024-01-25 MED ORDER — METFORMIN HCL 500 MG PO TABS: 500.0000 mg | ORAL_TABLET | Freq: Two times a day (BID) | ORAL | 3 refills | Status: AC

## 2024-01-25 MED ORDER — RAMIPRIL 10 MG PO CAPS: ORAL_CAPSULE | ORAL | 1 refills | Status: AC

## 2024-01-25 MED ORDER — ROSUVASTATIN CALCIUM 5 MG PO TABS: 5.0000 mg | ORAL_TABLET | Freq: Every day | ORAL | 1 refills | Status: AC

## 2024-04-08 ENCOUNTER — Other Ambulatory Visit: Payer: Self-pay | Admitting: Internal Medicine

## 2024-04-21 ENCOUNTER — Other Ambulatory Visit: Payer: Self-pay | Admitting: Internal Medicine

## 2024-10-10 ENCOUNTER — Other Ambulatory Visit: Payer: Self-pay | Admitting: Internal Medicine

## 2024-10-13 ENCOUNTER — Other Ambulatory Visit: Payer: Self-pay | Admitting: Internal Medicine
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
## 2024-10-26 ENCOUNTER — Other Ambulatory Visit: Payer: 59

## 2024-10-26 DIAGNOSIS — R7302 Impaired glucose tolerance (oral): Secondary | ICD-10-CM

## 2024-10-26 DIAGNOSIS — M8580 Other specified disorders of bone density and structure, unspecified site: Secondary | ICD-10-CM

## 2024-10-26 DIAGNOSIS — I10 Essential (primary) hypertension: Secondary | ICD-10-CM

## 2024-10-26 DIAGNOSIS — E7800 Pure hypercholesterolemia, unspecified: Secondary | ICD-10-CM

## 2024-10-26 DIAGNOSIS — E785 Hyperlipidemia, unspecified: Secondary | ICD-10-CM

## 2024-10-26 DIAGNOSIS — Z0000 Encounter for general adult medical examination without abnormal findings: Secondary | ICD-10-CM

## 2024-10-27 ENCOUNTER — Ambulatory Visit: Payer: Self-pay | Admitting: Internal Medicine

## 2024-10-27 LAB — LIPID PANEL
Cholesterol: 168 mg/dL
HDL: 85 mg/dL
LDL Cholesterol (Calc): 70 mg/dL
Non-HDL Cholesterol (Calc): 83 mg/dL
Total CHOL/HDL Ratio: 2 (calc)
Triglycerides: 52 mg/dL

## 2024-10-27 LAB — CBC WITH DIFFERENTIAL/PLATELET
Absolute Lymphocytes: 1959 {cells}/uL (ref 850–3900)
Absolute Monocytes: 434 {cells}/uL (ref 200–950)
Basophils Absolute: 19 {cells}/uL (ref 0–200)
Basophils Relative: 0.3 %
Eosinophils Absolute: 99 {cells}/uL (ref 15–500)
Eosinophils Relative: 1.6 %
HCT: 44.4 % (ref 35.9–46.0)
Hemoglobin: 14.5 g/dL (ref 11.7–15.5)
MCH: 28.1 pg (ref 27.0–33.0)
MCHC: 32.7 g/dL (ref 31.6–35.4)
MCV: 86 fL (ref 81.4–101.7)
MPV: 10.2 fL (ref 7.5–12.5)
Monocytes Relative: 7 %
Neutro Abs: 3689 {cells}/uL (ref 1500–7800)
Neutrophils Relative %: 59.5 %
Platelets: 243 10*3/uL (ref 140–400)
RBC: 5.16 Million/uL — ABNORMAL HIGH (ref 3.80–5.10)
RDW: 12.7 % (ref 11.0–15.0)
Total Lymphocyte: 31.6 %
WBC: 6.2 10*3/uL (ref 3.8–10.8)

## 2024-10-27 LAB — COMPREHENSIVE METABOLIC PANEL WITH GFR
AG Ratio: 2 (calc) (ref 1.0–2.5)
ALT: 23 U/L (ref 6–29)
AST: 18 U/L (ref 10–35)
Albumin: 4.9 g/dL (ref 3.6–5.1)
Alkaline phosphatase (APISO): 79 U/L (ref 37–153)
BUN: 25 mg/dL (ref 7–25)
CO2: 30 mmol/L (ref 20–32)
Calcium: 10.5 mg/dL — ABNORMAL HIGH (ref 8.6–10.4)
Chloride: 105 mmol/L (ref 98–110)
Creat: 0.73 mg/dL (ref 0.50–1.05)
Globulin: 2.5 g/dL (ref 1.9–3.7)
Glucose, Bld: 108 mg/dL — ABNORMAL HIGH (ref 65–99)
Potassium: 5.9 mmol/L — ABNORMAL HIGH (ref 3.5–5.3)
Sodium: 143 mmol/L (ref 135–146)
Total Bilirubin: 0.3 mg/dL (ref 0.2–1.2)
Total Protein: 7.4 g/dL (ref 6.1–8.1)
eGFR: 91 mL/min/{1.73_m2}

## 2024-10-27 LAB — HEMOGLOBIN A1C
Hgb A1c MFr Bld: 5.9 % — ABNORMAL HIGH
Mean Plasma Glucose: 123 mg/dL
eAG (mmol/L): 6.8 mmol/L

## 2024-10-27 LAB — TSH: TSH: 2.25 m[IU]/L (ref 0.40–4.50)

## 2024-10-29 ENCOUNTER — Encounter: Payer: 59 | Admitting: Internal Medicine
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
## 2024-11-16 ENCOUNTER — Encounter: Admitting: Internal Medicine
